# Patient Record
Sex: Female | Born: 1955 | Race: Black or African American | Hispanic: No | Marital: Married | Smoking: Never smoker
Health system: Southern US, Community
[De-identification: ages and names within clinical notes are randomized; demographics above are authoritative.]

## PROBLEM LIST (undated history)

## (undated) DIAGNOSIS — I10 Essential (primary) hypertension: Secondary | ICD-10-CM

## (undated) DIAGNOSIS — R7303 Prediabetes: Secondary | ICD-10-CM

## (undated) DIAGNOSIS — E119 Type 2 diabetes mellitus without complications: Secondary | ICD-10-CM

## (undated) DIAGNOSIS — S83249A Other tear of medial meniscus, current injury, unspecified knee, initial encounter: Secondary | ICD-10-CM

## (undated) DIAGNOSIS — K219 Gastro-esophageal reflux disease without esophagitis: Secondary | ICD-10-CM

## (undated) DIAGNOSIS — E785 Hyperlipidemia, unspecified: Secondary | ICD-10-CM

## (undated) HISTORY — PX: ANKLE SURGERY: SHX546

## (undated) HISTORY — PX: ACHILLES TENDON REPAIR: SUR1153

## (undated) HISTORY — DX: Hyperlipidemia, unspecified: E78.5

## (undated) HISTORY — PX: ABDOMINAL HYSTERECTOMY: SHX81

## (undated) HISTORY — DX: Type 2 diabetes mellitus without complications: E11.9

---

## 1997-12-22 ENCOUNTER — Emergency Department (HOSPITAL_COMMUNITY): Admission: EM | Admit: 1997-12-22 | Discharge: 1997-12-22 | Payer: Self-pay | Admitting: Emergency Medicine

## 1997-12-26 ENCOUNTER — Ambulatory Visit (HOSPITAL_BASED_OUTPATIENT_CLINIC_OR_DEPARTMENT_OTHER): Admission: RE | Admit: 1997-12-26 | Discharge: 1997-12-26 | Payer: Self-pay | Admitting: *Deleted

## 2000-02-26 ENCOUNTER — Encounter: Payer: Self-pay | Admitting: Family Medicine

## 2000-02-26 ENCOUNTER — Encounter: Admission: RE | Admit: 2000-02-26 | Discharge: 2000-02-26 | Payer: Self-pay | Admitting: Family Medicine

## 2000-03-18 ENCOUNTER — Encounter: Payer: Self-pay | Admitting: Family Medicine

## 2000-03-18 ENCOUNTER — Encounter: Admission: RE | Admit: 2000-03-18 | Discharge: 2000-03-18 | Payer: Self-pay | Admitting: Family Medicine

## 2001-06-13 ENCOUNTER — Encounter: Payer: Self-pay | Admitting: Family Medicine

## 2001-06-13 ENCOUNTER — Encounter: Admission: RE | Admit: 2001-06-13 | Discharge: 2001-06-13 | Payer: Self-pay | Admitting: Family Medicine

## 2002-01-19 ENCOUNTER — Encounter: Payer: Self-pay | Admitting: Family Medicine

## 2002-01-19 ENCOUNTER — Encounter: Admission: RE | Admit: 2002-01-19 | Discharge: 2002-01-19 | Payer: Self-pay | Admitting: Family Medicine

## 2002-03-30 ENCOUNTER — Encounter: Payer: Self-pay | Admitting: Family Medicine

## 2002-03-30 ENCOUNTER — Encounter: Admission: RE | Admit: 2002-03-30 | Discharge: 2002-03-30 | Payer: Self-pay | Admitting: Family Medicine

## 2002-07-27 ENCOUNTER — Encounter: Admission: RE | Admit: 2002-07-27 | Discharge: 2002-07-27 | Payer: Self-pay | Admitting: Family Medicine

## 2002-07-27 ENCOUNTER — Encounter: Payer: Self-pay | Admitting: Family Medicine

## 2003-01-30 ENCOUNTER — Encounter: Payer: Self-pay | Admitting: Family Medicine

## 2003-01-30 ENCOUNTER — Encounter: Admission: RE | Admit: 2003-01-30 | Discharge: 2003-01-30 | Payer: Self-pay | Admitting: Family Medicine

## 2003-01-31 ENCOUNTER — Encounter: Admission: RE | Admit: 2003-01-31 | Discharge: 2003-01-31 | Payer: Self-pay | Admitting: Family Medicine

## 2003-01-31 ENCOUNTER — Encounter: Payer: Self-pay | Admitting: Family Medicine

## 2003-02-01 ENCOUNTER — Encounter: Admission: RE | Admit: 2003-02-01 | Discharge: 2003-02-01 | Payer: Self-pay | Admitting: Family Medicine

## 2003-02-01 ENCOUNTER — Encounter: Payer: Self-pay | Admitting: Family Medicine

## 2003-08-02 ENCOUNTER — Encounter: Admission: RE | Admit: 2003-08-02 | Discharge: 2003-08-02 | Payer: Self-pay | Admitting: Family Medicine

## 2003-08-30 ENCOUNTER — Encounter: Admission: RE | Admit: 2003-08-30 | Discharge: 2003-08-30 | Payer: Self-pay | Admitting: Gastroenterology

## 2004-06-26 ENCOUNTER — Ambulatory Visit (HOSPITAL_COMMUNITY): Admission: RE | Admit: 2004-06-26 | Discharge: 2004-06-26 | Payer: Self-pay | Admitting: Gastroenterology

## 2004-08-07 ENCOUNTER — Encounter: Admission: RE | Admit: 2004-08-07 | Discharge: 2004-08-07 | Payer: Self-pay | Admitting: Family Medicine

## 2004-11-11 ENCOUNTER — Ambulatory Visit (HOSPITAL_COMMUNITY): Admission: RE | Admit: 2004-11-11 | Discharge: 2004-11-11 | Payer: Self-pay | Admitting: General Surgery

## 2004-11-11 ENCOUNTER — Encounter (INDEPENDENT_AMBULATORY_CARE_PROVIDER_SITE_OTHER): Payer: Self-pay | Admitting: *Deleted

## 2005-07-21 ENCOUNTER — Ambulatory Visit: Payer: Self-pay | Admitting: Licensed Clinical Social Worker

## 2005-08-03 ENCOUNTER — Ambulatory Visit: Payer: Self-pay | Admitting: Licensed Clinical Social Worker

## 2005-08-20 ENCOUNTER — Ambulatory Visit: Payer: Self-pay | Admitting: Licensed Clinical Social Worker

## 2005-08-27 ENCOUNTER — Encounter: Admission: RE | Admit: 2005-08-27 | Discharge: 2005-08-27 | Payer: Self-pay | Admitting: Family Medicine

## 2005-09-10 ENCOUNTER — Ambulatory Visit: Payer: Self-pay | Admitting: Licensed Clinical Social Worker

## 2006-02-02 ENCOUNTER — Emergency Department (HOSPITAL_COMMUNITY): Admission: EM | Admit: 2006-02-02 | Discharge: 2006-02-02 | Payer: Self-pay | Admitting: Emergency Medicine

## 2006-02-06 ENCOUNTER — Emergency Department (HOSPITAL_COMMUNITY): Admission: EM | Admit: 2006-02-06 | Discharge: 2006-02-06 | Payer: Self-pay | Admitting: Emergency Medicine

## 2006-02-09 ENCOUNTER — Encounter: Admission: RE | Admit: 2006-02-09 | Discharge: 2006-02-09 | Payer: Self-pay | Admitting: Family Medicine

## 2006-09-05 ENCOUNTER — Encounter: Admission: RE | Admit: 2006-09-05 | Discharge: 2006-09-05 | Payer: Self-pay | Admitting: Family Medicine

## 2006-11-18 ENCOUNTER — Encounter: Admission: RE | Admit: 2006-11-18 | Discharge: 2006-11-18 | Payer: Self-pay | Admitting: Internal Medicine

## 2007-09-15 ENCOUNTER — Encounter: Admission: RE | Admit: 2007-09-15 | Discharge: 2007-09-15 | Payer: Self-pay | Admitting: Family Medicine

## 2008-04-29 ENCOUNTER — Ambulatory Visit (HOSPITAL_COMMUNITY): Admission: RE | Admit: 2008-04-29 | Discharge: 2008-04-29 | Payer: Self-pay | Admitting: General Surgery

## 2008-04-29 ENCOUNTER — Encounter (HOSPITAL_BASED_OUTPATIENT_CLINIC_OR_DEPARTMENT_OTHER): Payer: Self-pay | Admitting: General Surgery

## 2008-10-04 ENCOUNTER — Encounter: Admission: RE | Admit: 2008-10-04 | Discharge: 2008-10-04 | Payer: Self-pay | Admitting: Family Medicine

## 2009-04-18 ENCOUNTER — Encounter: Admission: RE | Admit: 2009-04-18 | Discharge: 2009-04-18 | Payer: Self-pay | Admitting: Family Medicine

## 2009-10-10 ENCOUNTER — Encounter: Admission: RE | Admit: 2009-10-10 | Discharge: 2009-10-10 | Payer: Self-pay | Admitting: Family Medicine

## 2010-02-27 ENCOUNTER — Ambulatory Visit: Payer: Self-pay | Admitting: Licensed Clinical Social Worker

## 2010-04-17 ENCOUNTER — Other Ambulatory Visit: Admission: RE | Admit: 2010-04-17 | Discharge: 2010-04-17 | Payer: Self-pay | Admitting: Family Medicine

## 2010-09-05 ENCOUNTER — Encounter: Payer: Self-pay | Admitting: Gastroenterology

## 2010-09-28 ENCOUNTER — Other Ambulatory Visit: Payer: Self-pay | Admitting: Family Medicine

## 2010-09-28 DIAGNOSIS — Z139 Encounter for screening, unspecified: Secondary | ICD-10-CM

## 2010-10-16 ENCOUNTER — Ambulatory Visit
Admission: RE | Admit: 2010-10-16 | Discharge: 2010-10-16 | Disposition: A | Discharge status: Home / Self Care | Payer: BC Managed Care – PPO | Source: Ambulatory Visit | Attending: Family Medicine | Admitting: Family Medicine

## 2010-10-16 DIAGNOSIS — Z139 Encounter for screening, unspecified: Secondary | ICD-10-CM

## 2010-12-29 NOTE — Op Note (Signed)
NAME:  Cheryl Baker, Cheryl Baker NO.:  [id number]   MEDICAL RECORD NO.:  [id number]          PATIENT TYPE:  AMB   LOCATION:  DAY                          FACILITY:  [facility]   PHYSICIAN:  Leonie Man, M.D.   DATE OF BIRTH:  13-May-1956   DATE OF PROCEDURE:  04/29/2008  DATE OF DISCHARGE:                               OPERATIVE REPORT   PREOPERATIVE DIAGNOSIS:  Hemorrhoidal disease   POSTOPERATIVE DIAGNOSIS:  Hemorrhoidal disease.   PROCEDURE:  Hemorrhoidectomy.   SURGEON:  Leonie Man, M.D.   ASSISTANT:  OR tech   ANESTHESIA:  General.   The patient positioned prone jackknife position.   Cheryl Baker is a 52-year lady who underwent BPH, hemorrhoidectomy in the  past in 2006.  She has, over that time, maintained rather large external  hemorrhoids and some persistence of her internal hemorrhoids.  She comes  to the operating room now for a hemorrhoidectomy after risks and  potential benefits of surgery have been discussed, all questions  answered and consent obtained.   PROCEDURE:  The patient is positioned prone jackknife position following  induction of satisfactory general anesthesia.  The operation to be for  performed is hemorrhoidectomy and the patient positively identified.  All of preoperative surgical precautions were carried out.  The perianal  tissues were infiltrated with 1% lidocaine with epinephrine and the anal  verge dilated up to 3 fingerbreadths.  The operating anoscope was placed  in and the hemorrhoid is located at 4 o'clock, 8 o'clock and 6 o'clock  position.  The patient is in the prone jackknife position, identified,  each of these hemorrhoids was then infiltrated with Xeroform of 1%  lidocaine with epinephrine.  I made an elliptical incision over the  hemorrhoid, carrying it across the mucosa and mucocutaneous junction,  dissecting the hemorrhoid off of hemorrhoids off of the external  sphincter muscle and removing it in its entirety.  The  mucosa and  mucocutaneous junction are closed with a running suture of 4-0 chromic  catgut.  Similarly, a hemorrhoid at the 8 o'clock position and 6 o'clock  position were then incised with elliptical incision carried out over the  mucosa and mucocutaneous junction with passing of the hemorrhoid off of  the SI sphincter muscle and removing it in its entirety.  All incisions  were closed with running suture of 4-0 chromic catgut carried along the  mucosa and to mucocutaneous junction.  Hemorrhoids removed and forwarded  for pathologic evaluation.  All the dissection checked for hemostasis.  Hemostasis was noted to be excellent.  Sponge and instrument counts were  verified.  I then  placed Gelfoam pads over each of the incisions.  This was soaked in  lidocaine with epinephrine.  A sterile dressing was applied and the  anesthetic reversed.  The patient removed from the operating room to the  recovery room in stable condition.  She tolerated the procedure well.      Leonie Man, M.D.  Electronically Signed     PB/MEDQ  D:  04/29/2008  T:  04/30/2008  Job:  [id number]

## 2011-01-01 NOTE — Op Note (Signed)
NAME:  Cheryl Baker, Cheryl Baker NO.:  [id number]   MEDICAL RECORD NO.:  [id number]          PATIENT TYPE:  AMB   LOCATION:  ENDO                         FACILITY:  [facility]   PHYSICIAN:  Petra Kuba, M.D.    DATE OF BIRTH:  Apr 12, 1956   DATE OF PROCEDURE:  06/26/2004  DATE OF DISCHARGE:                                 OPERATIVE REPORT   PROCEDURE:  Esophagogastroduodenoscopy.   INDICATIONS:  Upper tract symptoms, some help to Protonix.  Want to re-  evaluate.  Consent was signed after risks, benefits, methods, and options  thoroughly discussed in the office.   MEDICINES USED:  None since it followed the colonoscopy.   PROCEDURE:  The video endoscope was inserted by direct vision.  The proximal  and midesophagus was normal.  In the distal esophagus was a tiny to small  hiatal hernia.  Maybe there was a slight ring just above it seen.  The scope  passed easily through this area.  However, she had some retching and  belching at this junction and a little heme was seen at the GE junction;  however, on repeat evaluation no active bleeding was seen, and the blood was  washed and suctioned.  Possibly it was due to a little bit of trauma from  either the scope or a minimal Mallory-Weiss tear on the ring from the  belching.  The scope passed through a normal antrum, normal  pylorus, into a  normal duodenal bulb.  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  We were able to advance around the C-loop to a normal second  portion of the duodenum.  The scope was withdrawn back to the bulb, and a  good look there ruled out any abnormalities there except for the  diverticulum.  The scope was withdrawn back to the stomach and retroflexed.  High in the cardia the tiny to small hiatal hernia was confirmed.  The  fundus, angularis, lesser and greater curve were normal on retroflex  visualization.  Straight visualization in the stomach did not reveal any  additional  findings.  Air was suctioned and the scope was slowly withdrawn.  Again a good look at the distal esophagus did not reveal any active bleeding  and confirmed the above findings.  The scope was removed.  The patient  tolerated the procedure well.  No other esophageal abnormality seen.  There  was no obvious immediate significant complication.   ENDOSCOPIC DIAGNOSES:  1.  Tiny to small hiatal hernia with minimal ring, status post minimal      trauma or Mallory-Weiss tear with belching versus scope trauma.  2.  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  3.  Otherwise normal esophagogastroduodenoscopy.   PLAN:  1.  Continue pump inhibitors.  2.  Very careful with aspirin and nonsteroidals.  3.  Follow up with me p.r.n., otherwise return care to Dr. Arvilla Market as      mentioned in colonoscopy report.       MEM/MEDQ  D:  06/26/2004  T:  06/27/2004  Job:  [id number]   cc:   Izora Ribas  Arvilla Market, M.D.  [address] [place]  [place] [address]  Fax: [phone]

## 2011-01-01 NOTE — Op Note (Signed)
NAME:  [name], [name] NO.:  [id number]   MEDICAL RECORD NO.:  [id number]          PATIENT TYPE:  AMB   LOCATION:  DAY                          FACILITY:  [facility]   PHYSICIAN:  Leonie Man, M.D.   DATE OF BIRTH:  11/09/1955   DATE OF PROCEDURE:  11/11/2004  DATE OF DISCHARGE:                                 OPERATIVE REPORT   PREOPERATIVE DIAGNOSIS:  Grade 4 prolapsing hemorrhoidal disease.   POSTOPERATIVE DIAGNOSIS:  Grade 4 prolapsing hemorrhoidal disease.   PROCEDURE:  PPH (procedure for prolapsing hemorrhoid).   SURGEON:  Leonie Man, M.D.   ASSISTANT:  OR tech.   ANESTHESIA:  General. The patient is in the prone jackknife position.   DESCRIPTION OF PROCEDURE:  The patient is a 55 year old female with  permanently prolapsing internal hemorrhoidal disease who comes to the  operating room now for a procedure for hemorrhoidal prolapse. The risks and  potential benefits of surgery have been fully discussed with her, all  questions answered, and consent obtained.   The patient was brought to the operating room and positively identified to  be Cheryl Baker. Following induction of satisfactory general anesthesia,  she was positioned in the prone jackknife position and the buttock cheeks  were spread apart and held with tapes. The perianal tissues as well as the  vaginal vault were prepped with Betadine. Sterile drapes were applied. The  perianal tissues were infiltrated with 0.25% Marcaine with epinephrine in a  9:1 solution with Wydase. Dilatation of the anal verge up to three  fingerbreadths was then carried out. The operating anoscope was placed into  the anus and a pursestring suture was placed approximately 4-4-1/2 cm above  the dentate line and carried around as a pursestring suture. The vagina was  checked to be sure that there were no sutures holding the vaginal septum.  The PTH stepping device was then placed with the anvil above the staple  line  and this was drawn taut and tied. The stapling device was then screwed down  into the anal vault down to the 4 cm mark and held in place for 1 minute to  allow the edema to be removed from the tissues.  The instrument was then  fired and held for an additional 1 minute and then removed. A complete  circle of mucosa was removed. Inspection along the suture line showed very  few areas of bleeding which were easily coagulated with electrocautery. All  sponge, instrument and sharp counts were then verified. A Gelfoam plug was  placed into the anus for additional hemostasis. The sterile dressings were  then placed on the wound, the anesthetic reversed. The patient was moved  from the operating room to the recovery room in stable condition. She  tolerated the procedure well.     PB/MEDQ  D:  11/11/2004  T:  11/11/2004  Job:  [id number]

## 2011-01-01 NOTE — Op Note (Signed)
NAME:  CARLOS, QUACKENBUSH NO.:  [id number]   MEDICAL RECORD NO.:  [id number]          PATIENT TYPE:  AMB   LOCATION:  ENDO                         FACILITY:  [facility]   PHYSICIAN:  Petra Kuba, M.D.    DATE OF BIRTH:  Feb 10, 1956   DATE OF PROCEDURE:  06/26/2004  DATE OF DISCHARGE:                                 OPERATIVE REPORT   PROCEDURE:  Colonoscopy.   INDICATIONS:  Screening.  Consent was signed after risks, benefits, methods,  and options thoroughly discussed in the office.   MEDICINES USED:  Demerol 75 mg, Versed 7 mg.   PROCEDURE:  Rectal inspection was pertinent for external hemorrhoids, small.  Digital exam was negative.  The video pediatric adjustable colonoscope was  inserted, easily advanced around the colon to the cecum.  This did not  require any abdominal pressure or any position changes.  No abnormalities  were seen on insertion.  The scope was inserted a short way into the  terminal ileum, which was normal.  Photo documentation was obtained.  The  cecum and the IC valve were normal.  The scope was slowly withdrawn.  The  prep was adequate.  There was some liquid stool that required washing and  suctioning.  On slow withdrawal through the colon, no abnormalities were  seen.  Once back in the rectum anorectal pull-through and retroflexion  confirmed some small hemorrhoids.  The scope was straightened and readvanced  a short way up the left side of the colon, air was suctioned, and the scope  removed.  The patient tolerated the procedure well.  There was no obvious  immediate complication.   ENDOSCOPIC DIAGNOSES:  1.  Internal-external small hemorrhoids with some small skin tags.  2.  Otherwise within normal limits to the terminal ileum.   PLAN:  1.  Return to Dr. Lurene Shadow p.r.n. for hemorrhoids.  2.  Continue workup with an EGD.  3.  Repeat screening in five to 10 years.  4.  Yearly rectals and guaiacs per Dr. Arvilla Market.       MEM/MEDQ  D:   06/26/2004  T:  06/27/2004  Job:  [id number]   cc:   Donia Guiles, M.D.  [address] [place]  [place] [address]  Fax: [phone]   Leonie Man, M.D.  [address]  [address]  [place]  [place] [address]  Fax: [phone]

## 2011-05-19 LAB — COMPREHENSIVE METABOLIC PANEL
Albumin: 4
CO2: 29
Calcium: 9.2
Chloride: 102
Creatinine, Ser: 0.83
GFR calc Af Amer: >60
GFR calc non Af Amer: >60
Potassium: 3.2 — ABNORMAL LOW
Total Protein: 7.9

## 2011-05-19 LAB — CBC
Platelets: 220
RBC: 5.1
WBC: 5.4

## 2011-05-19 LAB — DIFFERENTIAL
Basophils Absolute: 0
Basophils Relative: 1
Eosinophils Relative: 2
Lymphs Abs: 1.9
Monocytes Absolute: 0.5
Neutrophils Relative %: 52

## 2011-09-14 ENCOUNTER — Other Ambulatory Visit: Payer: Self-pay | Admitting: Family Medicine

## 2011-09-14 DIAGNOSIS — Z1231 Encounter for screening mammogram for malignant neoplasm of breast: Secondary | ICD-10-CM

## 2011-10-22 ENCOUNTER — Ambulatory Visit: Payer: BC Managed Care – PPO

## 2011-11-04 ENCOUNTER — Ambulatory Visit
Admission: RE | Admit: 2011-11-04 | Discharge: 2011-11-04 | Disposition: A | Discharge status: Home / Self Care | Payer: BC Managed Care – PPO | Source: Ambulatory Visit | Attending: Family Medicine | Admitting: Family Medicine

## 2011-11-04 DIAGNOSIS — Z1231 Encounter for screening mammogram for malignant neoplasm of breast: Secondary | ICD-10-CM

## 2011-11-05 ENCOUNTER — Ambulatory Visit: Payer: BC Managed Care – PPO

## 2011-11-21 ENCOUNTER — Other Ambulatory Visit: Payer: Self-pay

## 2011-11-21 ENCOUNTER — Encounter (HOSPITAL_COMMUNITY): Payer: Self-pay | Admitting: *Deleted

## 2011-11-21 ENCOUNTER — Emergency Department (HOSPITAL_COMMUNITY)
Admission: EM | Admit: 2011-11-21 | Discharge: 2011-11-21 | Disposition: A | Discharge status: Home / Self Care | Payer: BC Managed Care – PPO | Attending: Emergency Medicine | Admitting: Emergency Medicine

## 2011-11-21 ENCOUNTER — Emergency Department (HOSPITAL_COMMUNITY): Payer: BC Managed Care – PPO

## 2011-11-21 DIAGNOSIS — R05 Cough: Secondary | ICD-10-CM | POA: Insufficient documentation

## 2011-11-21 DIAGNOSIS — R059 Cough, unspecified: Secondary | ICD-10-CM | POA: Insufficient documentation

## 2011-11-21 DIAGNOSIS — R079 Chest pain, unspecified: Secondary | ICD-10-CM | POA: Insufficient documentation

## 2011-11-21 DIAGNOSIS — I10 Essential (primary) hypertension: Secondary | ICD-10-CM | POA: Insufficient documentation

## 2011-11-21 HISTORY — DX: Essential (primary) hypertension: I10

## 2011-11-21 MED ORDER — HYDROCODONE-ACETAMINOPHEN 5-325 MG PO TABS: 1.0000 | ORAL_TABLET | Freq: Four times a day (QID) | ORAL | Status: AC | PRN

## 2011-11-21 MED ORDER — BENZONATATE 100 MG PO CAPS: 100.0000 mg | ORAL_CAPSULE | Freq: Three times a day (TID) | ORAL | Status: AC

## 2011-11-21 NOTE — Discharge Instructions (Signed)
Cough, Adult  A cough is a reflex. It helps you clear your throat and airways. A cough can help heal your body. A cough can last 2 or 3 weeks (acute) or may last more than 8 weeks (chronic). Some common causes of a cough can include an infection, allergy, or a cold. HOME CARE  Only take medicine as told by your doctor.   If given, take your medicines (antibiotics) as told. Finish them even if you start to feel better.   Use a cold steam vaporizer or humidier in your home. This can help loosen thick spit (secretions).   Sleep so you are almost sitting up (semi-upright). Use pillows to do this. This helps reduce coughing.   Rest as needed.   Stop smoking if you smoke.  GET HELP RIGHT AWAY IF:  You have yellowish-white fluid (pus) in your thick spit.   Your cough gets worse.   Your medicine does not reduce coughing, and you are losing sleep.   You cough up blood.   You have trouble breathing.   Your pain gets worse and medicine does not help.   You have a fever.  MAKE SURE YOU:   Understand these instructions.   Will watch your condition.   Will get help right away if you are not doing well or get worse.  Document Released: 04/15/2011 Document Revised: 07/22/2011 Document Reviewed: 04/15/2011 Exit[employer] Patient Information 2012 [employer]. 

## 2011-11-21 NOTE — ED Provider Notes (Signed)
History     CSN: [id number]  Arrival date & time 11/21/11  3086   First MD Initiated Contact with Patient 11/21/11 [id number]      Chief Complaint  Patient presents with  . Cough  . Chest Pain  . Nausea    HPI Pt was diagnosed with bronchitis and PNA via x-ray 1 week ago. Pt reports symptoms worse on Thursday. Pt was not given any medication for PNA/Bronchitis. Pt reports productive cough and nasal drainage.  Pt states she was seen by [facility] in [facility].  Pt did see her primary doctor as well in the last week and was told that she was on for the abscess, sulfa, would be fine.  Pt is still coughing.  She is still on an inhaler and a cough medication.  Pt has been having nasal discharge.  She has noticed some blood in her sputum.  She came to the ED because the cough is still severe.  When she coughs she will have urinary incontinence.  Past Medical History  Diagnosis Date  . Hypertension     Past Surgical History  Procedure Date  . Abdominal hysterectomy   . Ankle surgery     No family history on file.  History  Substance Use Topics  . Smoking status: Never Smoker   . Smokeless tobacco: Not on file  . Alcohol Use: No    OB History    Grav Para Term Preterm Abortions TAB SAB Ect Mult Living                  Review of Systems  Neurological: Negative for tremors.  All other systems reviewed and are negative.    Allergies  Penicillins  Home Medications   Current Outpatient Rx  Name Route Sig Dispense Refill  . ATORVASTATIN CALCIUM 20 MG PO TABS Oral Take 20 mg by mouth daily.    . OMEGA-3 FATTY ACIDS 1000 MG PO CAPS Oral Take 2 g by mouth daily.    Marland Kitchen HYDROCHLOROTHIAZIDE 25 MG PO TABS Oral Take 25 mg by mouth daily.      BP 106/87  Pulse 80  Temp(Src) 98.8 F (37.1 C) (Oral)  Resp 18  SpO2 98%  Physical Exam  Nursing note and vitals reviewed. Constitutional: She appears well-developed and well-nourished. No distress.  HENT:  Head: Normocephalic and  atraumatic.  Right Ear: External ear normal.  Left Ear: External ear normal.  Eyes: Conjunctivae are normal. Right eye exhibits no discharge. Left eye exhibits no discharge. No scleral icterus.  Neck: Neck supple. No tracheal deviation present.  Cardiovascular: Normal rate, regular rhythm and intact distal pulses.   Pulmonary/Chest: Effort normal and breath sounds normal. No stridor. No respiratory distress. She has no wheezes. She has no rales.  Abdominal: Soft. Bowel sounds are normal. She exhibits no distension. There is no tenderness. There is no rebound and no guarding.  Musculoskeletal: She exhibits no edema and no tenderness.  Neurological: She is alert. She has normal strength. No sensory deficit. Cranial nerve deficit:  no gross defecits noted. She exhibits normal muscle tone. She displays no seizure activity. Coordination normal.  Skin: Skin is warm and dry. No rash noted.  Psychiatric: She has a normal mood and affect.    ED Course  Procedures (including critical care time)  Rate: 69  Rhythm: normal sinus rhythm  QRS Axis: normal  Intervals: normal except first degree AV block  ST/T Wave abnormalities: normal  Conduction Disutrbances:none  Narrative Interpretation:  Old EKG Reviewed: First degree AV block is new  Labs Reviewed - No data to display Dg Chest 2 View  11/21/2011  *RADIOLOGY REPORT*  Clinical Data: Cough and chest pain  CHEST - 2 VIEW  Comparison: 11/13/2011  Findings: Heart size is normal.  No pleural effusion or pulmonary edema.  No airspace consolidation identified.  Scar versus plate-like atelectasis noted within the left upper lobe.  IMPRESSION:  1.  Left upper lobe scar versus plate-like atelectasis.  Original Report Authenticated By: Rosealee Albee, M.D.       MDM  The patient has been seen by her primary doctor this week. She was told she may have had pneumonia. Chest x-ray today however does not suggest pneumonia and shows a small area of  atelectasis. I do not think the patient needs to be another course of antibiotics. I suspect her symptoms are more likely related to a viral type illness. I will discharge her home on a medication for cough to help her symptoms.        Celene Kras, MD 11/21/11 [phone]

## 2011-11-21 NOTE — ED Notes (Signed)
Per pt - reports chest tightness, coughing and chills starting Wednesday and was seen at a walk-in clinic on Saturday. Pt was diagnosed with bronchitis and PNA via x-ray.  Pt reports symptoms worse on Thursday.  Pt was not given any medication for PNA/Bronchitis.  Pt has just finished Sulfamethoxazole-TMP for an abscess. Pt reports productive cough and nasal drainage.

## 2011-12-16 ENCOUNTER — Other Ambulatory Visit: Payer: Self-pay | Admitting: Family Medicine

## 2011-12-16 ENCOUNTER — Ambulatory Visit
Admission: RE | Admit: 2011-12-16 | Discharge: 2011-12-16 | Disposition: A | Discharge status: Home / Self Care | Payer: BC Managed Care – PPO | Source: Ambulatory Visit | Attending: Family Medicine | Admitting: Family Medicine

## 2011-12-16 DIAGNOSIS — J189 Pneumonia, unspecified organism: Secondary | ICD-10-CM

## 2012-10-11 ENCOUNTER — Other Ambulatory Visit: Payer: Self-pay

## 2012-10-11 ENCOUNTER — Other Ambulatory Visit: Payer: Self-pay | Admitting: Family Medicine

## 2012-10-11 DIAGNOSIS — Z1231 Encounter for screening mammogram for malignant neoplasm of breast: Secondary | ICD-10-CM

## 2012-11-07 ENCOUNTER — Ambulatory Visit
Admission: RE | Admit: 2012-11-07 | Discharge: 2012-11-07 | Disposition: A | Discharge status: Home / Self Care | Payer: BC Managed Care – PPO | Source: Ambulatory Visit

## 2012-11-07 DIAGNOSIS — Z1231 Encounter for screening mammogram for malignant neoplasm of breast: Secondary | ICD-10-CM

## 2013-09-26 ENCOUNTER — Other Ambulatory Visit: Payer: Self-pay

## 2013-09-26 DIAGNOSIS — Z1231 Encounter for screening mammogram for malignant neoplasm of breast: Secondary | ICD-10-CM

## 2013-11-09 ENCOUNTER — Ambulatory Visit: Payer: BC Managed Care – PPO

## 2013-11-09 ENCOUNTER — Ambulatory Visit
Admission: RE | Admit: 2013-11-09 | Discharge: 2013-11-09 | Disposition: A | Discharge status: Home / Self Care | Payer: Self-pay | Source: Ambulatory Visit

## 2013-11-09 DIAGNOSIS — Z1231 Encounter for screening mammogram for malignant neoplasm of breast: Secondary | ICD-10-CM

## 2014-06-26 ENCOUNTER — Ambulatory Visit
Admission: RE | Admit: 2014-06-26 | Discharge: 2014-06-26 | Disposition: A | Discharge status: Home / Self Care | Payer: BC Managed Care – PPO | Source: Ambulatory Visit | Attending: Family Medicine | Admitting: Family Medicine

## 2014-06-26 ENCOUNTER — Other Ambulatory Visit: Payer: Self-pay | Admitting: Family Medicine

## 2014-06-26 DIAGNOSIS — R52 Pain, unspecified: Secondary | ICD-10-CM

## 2014-09-20 ENCOUNTER — Ambulatory Visit: Payer: Self-pay | Admitting: Licensed Clinical Social Worker

## 2014-12-06 ENCOUNTER — Other Ambulatory Visit: Payer: Self-pay

## 2014-12-06 DIAGNOSIS — Z1231 Encounter for screening mammogram for malignant neoplasm of breast: Secondary | ICD-10-CM

## 2014-12-11 ENCOUNTER — Ambulatory Visit
Admission: RE | Admit: 2014-12-11 | Discharge: 2014-12-11 | Disposition: A | Discharge status: Home / Self Care | Payer: BLUE CROSS/BLUE SHIELD | Source: Ambulatory Visit

## 2014-12-11 DIAGNOSIS — Z1231 Encounter for screening mammogram for malignant neoplasm of breast: Secondary | ICD-10-CM

## 2015-11-14 ENCOUNTER — Other Ambulatory Visit: Payer: Self-pay

## 2015-11-14 DIAGNOSIS — Z1231 Encounter for screening mammogram for malignant neoplasm of breast: Secondary | ICD-10-CM

## 2015-12-03 DIAGNOSIS — Z83511 Family history of glaucoma: Secondary | ICD-10-CM | POA: Diagnosis not present

## 2015-12-03 DIAGNOSIS — H40053 Ocular hypertension, bilateral: Secondary | ICD-10-CM | POA: Diagnosis not present

## 2015-12-03 DIAGNOSIS — H40013 Open angle with borderline findings, low risk, bilateral: Secondary | ICD-10-CM | POA: Diagnosis not present

## 2015-12-04 DIAGNOSIS — L658 Other specified nonscarring hair loss: Secondary | ICD-10-CM | POA: Diagnosis not present

## 2015-12-04 DIAGNOSIS — L249 Irritant contact dermatitis, unspecified cause: Secondary | ICD-10-CM | POA: Diagnosis not present

## 2015-12-04 DIAGNOSIS — L219 Seborrheic dermatitis, unspecified: Secondary | ICD-10-CM | POA: Diagnosis not present

## 2015-12-12 ENCOUNTER — Ambulatory Visit
Admission: RE | Admit: 2015-12-12 | Discharge: 2015-12-12 | Disposition: A | Discharge status: Home / Self Care | Payer: BLUE CROSS/BLUE SHIELD | Source: Ambulatory Visit

## 2015-12-12 DIAGNOSIS — Z1231 Encounter for screening mammogram for malignant neoplasm of breast: Secondary | ICD-10-CM | POA: Diagnosis not present

## 2015-12-29 ENCOUNTER — Other Ambulatory Visit: Payer: Self-pay | Admitting: Family Medicine

## 2015-12-29 DIAGNOSIS — E782 Mixed hyperlipidemia: Secondary | ICD-10-CM | POA: Diagnosis not present

## 2015-12-29 DIAGNOSIS — E669 Obesity, unspecified: Secondary | ICD-10-CM | POA: Diagnosis not present

## 2015-12-29 DIAGNOSIS — R1310 Dysphagia, unspecified: Secondary | ICD-10-CM

## 2015-12-29 DIAGNOSIS — I10 Essential (primary) hypertension: Secondary | ICD-10-CM | POA: Diagnosis not present

## 2015-12-29 DIAGNOSIS — R7303 Prediabetes: Secondary | ICD-10-CM | POA: Diagnosis not present

## 2016-01-01 ENCOUNTER — Ambulatory Visit
Admission: RE | Admit: 2016-01-01 | Discharge: 2016-01-01 | Disposition: A | Discharge status: Home / Self Care | Payer: BLUE CROSS/BLUE SHIELD | Source: Ambulatory Visit | Attending: Family Medicine | Admitting: Family Medicine

## 2016-01-01 DIAGNOSIS — R1312 Dysphagia, oropharyngeal phase: Secondary | ICD-10-CM | POA: Diagnosis not present

## 2016-01-01 DIAGNOSIS — R1310 Dysphagia, unspecified: Secondary | ICD-10-CM

## 2016-01-01 DIAGNOSIS — K219 Gastro-esophageal reflux disease without esophagitis: Secondary | ICD-10-CM | POA: Diagnosis not present

## 2016-01-23 DIAGNOSIS — M7672 Peroneal tendinitis, left leg: Secondary | ICD-10-CM | POA: Diagnosis not present

## 2016-02-06 DIAGNOSIS — M7672 Peroneal tendinitis, left leg: Secondary | ICD-10-CM | POA: Diagnosis not present

## 2016-02-06 DIAGNOSIS — R262 Difficulty in walking, not elsewhere classified: Secondary | ICD-10-CM | POA: Diagnosis not present

## 2016-06-03 DIAGNOSIS — Z83511 Family history of glaucoma: Secondary | ICD-10-CM | POA: Diagnosis not present

## 2016-06-03 DIAGNOSIS — H40013 Open angle with borderline findings, low risk, bilateral: Secondary | ICD-10-CM | POA: Diagnosis not present

## 2016-06-15 DIAGNOSIS — L669 Cicatricial alopecia, unspecified: Secondary | ICD-10-CM | POA: Diagnosis not present

## 2016-06-15 DIAGNOSIS — L219 Seborrheic dermatitis, unspecified: Secondary | ICD-10-CM | POA: Diagnosis not present

## 2016-07-13 DIAGNOSIS — H40013 Open angle with borderline findings, low risk, bilateral: Secondary | ICD-10-CM | POA: Diagnosis not present

## 2016-07-22 DIAGNOSIS — Z23 Encounter for immunization: Secondary | ICD-10-CM | POA: Diagnosis not present

## 2016-07-22 DIAGNOSIS — F325 Major depressive disorder, single episode, in full remission: Secondary | ICD-10-CM | POA: Diagnosis not present

## 2016-07-22 DIAGNOSIS — I10 Essential (primary) hypertension: Secondary | ICD-10-CM | POA: Diagnosis not present

## 2016-07-22 DIAGNOSIS — Z0000 Encounter for general adult medical examination without abnormal findings: Secondary | ICD-10-CM | POA: Diagnosis not present

## 2016-07-22 DIAGNOSIS — R7303 Prediabetes: Secondary | ICD-10-CM | POA: Diagnosis not present

## 2016-07-22 DIAGNOSIS — E782 Mixed hyperlipidemia: Secondary | ICD-10-CM | POA: Diagnosis not present

## 2016-08-11 DIAGNOSIS — R0981 Nasal congestion: Secondary | ICD-10-CM | POA: Diagnosis not present

## 2016-08-11 DIAGNOSIS — J069 Acute upper respiratory infection, unspecified: Secondary | ICD-10-CM | POA: Diagnosis not present

## 2016-08-25 DIAGNOSIS — R062 Wheezing: Secondary | ICD-10-CM | POA: Diagnosis not present

## 2016-08-25 DIAGNOSIS — J329 Chronic sinusitis, unspecified: Secondary | ICD-10-CM | POA: Diagnosis not present

## 2016-08-31 DIAGNOSIS — J329 Chronic sinusitis, unspecified: Secondary | ICD-10-CM | POA: Diagnosis not present

## 2016-10-25 DIAGNOSIS — R7303 Prediabetes: Secondary | ICD-10-CM | POA: Diagnosis not present

## 2016-10-25 DIAGNOSIS — E782 Mixed hyperlipidemia: Secondary | ICD-10-CM | POA: Diagnosis not present

## 2016-11-05 DIAGNOSIS — Z23 Encounter for immunization: Secondary | ICD-10-CM | POA: Diagnosis not present

## 2016-11-05 DIAGNOSIS — E782 Mixed hyperlipidemia: Secondary | ICD-10-CM | POA: Diagnosis not present

## 2016-11-05 DIAGNOSIS — I10 Essential (primary) hypertension: Secondary | ICD-10-CM | POA: Diagnosis not present

## 2016-11-05 DIAGNOSIS — E119 Type 2 diabetes mellitus without complications: Secondary | ICD-10-CM | POA: Diagnosis not present

## 2016-11-23 ENCOUNTER — Other Ambulatory Visit: Payer: Self-pay | Admitting: Family Medicine

## 2016-11-23 DIAGNOSIS — Z1231 Encounter for screening mammogram for malignant neoplasm of breast: Secondary | ICD-10-CM

## 2016-12-07 DIAGNOSIS — L669 Cicatricial alopecia, unspecified: Secondary | ICD-10-CM | POA: Diagnosis not present

## 2016-12-07 DIAGNOSIS — L658 Other specified nonscarring hair loss: Secondary | ICD-10-CM | POA: Diagnosis not present

## 2016-12-07 DIAGNOSIS — L219 Seborrheic dermatitis, unspecified: Secondary | ICD-10-CM | POA: Diagnosis not present

## 2016-12-09 ENCOUNTER — Encounter: Payer: BLUE CROSS/BLUE SHIELD | Attending: Family Medicine | Admitting: Dietician

## 2016-12-09 ENCOUNTER — Encounter: Payer: Self-pay | Admitting: Dietician

## 2016-12-09 DIAGNOSIS — E119 Type 2 diabetes mellitus without complications: Secondary | ICD-10-CM | POA: Diagnosis not present

## 2016-12-09 DIAGNOSIS — Z713 Dietary counseling and surveillance: Secondary | ICD-10-CM | POA: Insufficient documentation

## 2016-12-09 NOTE — Progress Notes (Signed)
Patient was seen on 12/09/16 for the first of a series of three diabetes self-management courses at the [facility].  Patient Education Plan per assessed needs and concerns is to attend four course education program for Diabetes Self Management Education.  The following learning objectives were met by the patient during this class:  Describe diabetes  State some common risk factors for diabetes  Defines the role of glucose and insulin  Identifies type of diabetes and pathophysiology  Describe the relationship between diabetes and cardiovascular risk  State the members of the Healthcare Team  States the rationale for glucose monitoring  State when to test glucose  State their individual Target Range  State the importance of logging glucose readings  Describe how to interpret glucose readings  Identifies A1C target  Explain the correlation between A1c and eAG values  State symptoms and treatment of high blood glucose  State symptoms and treatment of low blood glucose  Explain proper technique for glucose testing  Identifies proper sharps disposal  Handouts given during class include:  Living Well with Diabetes book  Carb Counting and Meal Planning book  Meal Plan Card  Carbohydrate guide  Meal planning worksheet  Low Sodium Flavoring Tips  The diabetes portion plate  P5Q to eAG Conversion Chart  Diabetes Medications  Diabetes Recommended Care Schedule  Support Group  Diabetes Success Plan  Core Class Satisfaction Survey  Follow-Up Plan:  Attend core 2

## 2016-12-15 ENCOUNTER — Ambulatory Visit
Admission: RE | Admit: 2016-12-15 | Discharge: 2016-12-15 | Disposition: A | Discharge status: Home / Self Care | Payer: BLUE CROSS/BLUE SHIELD | Source: Ambulatory Visit | Attending: Family Medicine | Admitting: Family Medicine

## 2016-12-15 DIAGNOSIS — Z1231 Encounter for screening mammogram for malignant neoplasm of breast: Secondary | ICD-10-CM | POA: Diagnosis not present

## 2016-12-16 ENCOUNTER — Encounter: Payer: BLUE CROSS/BLUE SHIELD | Attending: Family Medicine | Admitting: Dietician

## 2016-12-16 DIAGNOSIS — Z713 Dietary counseling and surveillance: Secondary | ICD-10-CM | POA: Insufficient documentation

## 2016-12-16 DIAGNOSIS — E119 Type 2 diabetes mellitus without complications: Secondary | ICD-10-CM | POA: Insufficient documentation

## 2016-12-16 NOTE — Progress Notes (Signed)

## 2016-12-23 ENCOUNTER — Encounter: Payer: BLUE CROSS/BLUE SHIELD | Admitting: Dietician

## 2016-12-23 DIAGNOSIS — Z713 Dietary counseling and surveillance: Secondary | ICD-10-CM | POA: Diagnosis not present

## 2016-12-23 DIAGNOSIS — E119 Type 2 diabetes mellitus without complications: Secondary | ICD-10-CM

## 2016-12-23 NOTE — Progress Notes (Signed)
Patient was seen on 12/23/16 for the third of a series of three diabetes self-management courses at the [facility].   Cheryl Baker. State the amount of activity recommended for healthy living . Describe activities suitable for individual needs . Identify ways to regularly incorporate activity into daily life . Identify barriers to activity and ways to over come these barriers  Identify diabetes medications being personally used and their primary action for lowering glucose and possible side effects . Describe role of stress on blood glucose and develop strategies to address psychosocial issues . Identify diabetes complications and ways to prevent them  Explain how to manage diabetes during illness . Evaluate success in meeting personal goal . Establish 2-3 goals that they will plan to diligently work on until they return for the  2637-month follow-up visit  Goals:   I will count my carb choices at most meals and snacks  I will eat out less for lunch  I will be active 30 minutes or more 3 times a week  I will look at patterns in my record book at least 5 days a month  To help manage stress I will  exercise at least 3 times a week  Your patient has identified these potential barriers to change:  Motivation Finances Stress  Your patient has identified their diabetes self-care support plan as  Magazine Subscriptions Plan:  Attend Support Group as desired

## 2016-12-27 DIAGNOSIS — H40053 Ocular hypertension, bilateral: Secondary | ICD-10-CM | POA: Diagnosis not present

## 2016-12-27 DIAGNOSIS — H40013 Open angle with borderline findings, low risk, bilateral: Secondary | ICD-10-CM | POA: Diagnosis not present

## 2017-02-01 ENCOUNTER — Ambulatory Visit: Payer: BLUE CROSS/BLUE SHIELD | Admitting: Psychiatry

## 2017-03-21 DIAGNOSIS — Z0279 Encounter for issue of other medical certificate: Secondary | ICD-10-CM

## 2017-07-27 DIAGNOSIS — K5730 Diverticulosis of large intestine without perforation or abscess without bleeding: Secondary | ICD-10-CM | POA: Diagnosis not present

## 2017-07-27 DIAGNOSIS — Z86010 Personal history of colonic polyps: Secondary | ICD-10-CM | POA: Diagnosis not present

## 2017-08-04 DIAGNOSIS — E119 Type 2 diabetes mellitus without complications: Secondary | ICD-10-CM | POA: Diagnosis not present

## 2017-08-04 DIAGNOSIS — Z0000 Encounter for general adult medical examination without abnormal findings: Secondary | ICD-10-CM | POA: Diagnosis not present

## 2017-08-12 DIAGNOSIS — H40013 Open angle with borderline findings, low risk, bilateral: Secondary | ICD-10-CM | POA: Diagnosis not present

## 2017-08-12 DIAGNOSIS — Z83511 Family history of glaucoma: Secondary | ICD-10-CM | POA: Diagnosis not present

## 2017-08-12 DIAGNOSIS — H40053 Ocular hypertension, bilateral: Secondary | ICD-10-CM | POA: Diagnosis not present

## 2017-10-31 ENCOUNTER — Telehealth: Payer: Self-pay

## 2017-10-31 NOTE — Telephone Encounter (Signed)
LVM for patient to call back. I received FMLA forms and I have a few questions to ask before Dr. Okey Duprerawford can fill them out.  What dates does patient need to be out for?  When does Patient plan to go back to work?  When does patient plan on starting FMLA or if already out of  work what date was that?

## 2017-11-01 NOTE — Telephone Encounter (Signed)
This documentation was in error.

## 2017-11-08 DIAGNOSIS — Z0279 Encounter for issue of other medical certificate: Secondary | ICD-10-CM

## 2017-12-14 ENCOUNTER — Other Ambulatory Visit: Payer: Self-pay | Admitting: Family Medicine

## 2017-12-14 DIAGNOSIS — Z1231 Encounter for screening mammogram for malignant neoplasm of breast: Secondary | ICD-10-CM

## 2017-12-23 DIAGNOSIS — M25569 Pain in unspecified knee: Secondary | ICD-10-CM | POA: Diagnosis not present

## 2018-01-04 ENCOUNTER — Ambulatory Visit: Payer: Self-pay

## 2018-02-03 DIAGNOSIS — E782 Mixed hyperlipidemia: Secondary | ICD-10-CM | POA: Diagnosis not present

## 2018-02-03 DIAGNOSIS — I10 Essential (primary) hypertension: Secondary | ICD-10-CM | POA: Diagnosis not present

## 2018-02-03 DIAGNOSIS — E669 Obesity, unspecified: Secondary | ICD-10-CM | POA: Diagnosis not present

## 2018-02-03 DIAGNOSIS — E1169 Type 2 diabetes mellitus with other specified complication: Secondary | ICD-10-CM | POA: Diagnosis not present

## 2018-04-12 ENCOUNTER — Ambulatory Visit
Admission: RE | Admit: 2018-04-12 | Discharge: 2018-04-12 | Disposition: A | Discharge status: Home / Self Care | Payer: BLUE CROSS/BLUE SHIELD | Source: Ambulatory Visit | Attending: Family Medicine | Admitting: Family Medicine

## 2018-04-12 DIAGNOSIS — Z1231 Encounter for screening mammogram for malignant neoplasm of breast: Secondary | ICD-10-CM | POA: Diagnosis not present

## 2018-04-13 DIAGNOSIS — H40013 Open angle with borderline findings, low risk, bilateral: Secondary | ICD-10-CM | POA: Diagnosis not present

## 2018-06-02 DIAGNOSIS — Z23 Encounter for immunization: Secondary | ICD-10-CM | POA: Diagnosis not present

## 2018-07-07 DIAGNOSIS — H6980 Other specified disorders of Eustachian tube, unspecified ear: Secondary | ICD-10-CM | POA: Diagnosis not present

## 2018-07-07 DIAGNOSIS — J069 Acute upper respiratory infection, unspecified: Secondary | ICD-10-CM | POA: Diagnosis not present

## 2018-08-08 DIAGNOSIS — H40013 Open angle with borderline findings, low risk, bilateral: Secondary | ICD-10-CM | POA: Diagnosis not present

## 2018-08-11 DIAGNOSIS — E782 Mixed hyperlipidemia: Secondary | ICD-10-CM | POA: Diagnosis not present

## 2018-08-11 DIAGNOSIS — Z0000 Encounter for general adult medical examination without abnormal findings: Secondary | ICD-10-CM | POA: Diagnosis not present

## 2018-08-11 DIAGNOSIS — E1169 Type 2 diabetes mellitus with other specified complication: Secondary | ICD-10-CM | POA: Diagnosis not present

## 2018-08-11 DIAGNOSIS — F325 Major depressive disorder, single episode, in full remission: Secondary | ICD-10-CM | POA: Diagnosis not present

## 2018-08-11 DIAGNOSIS — I10 Essential (primary) hypertension: Secondary | ICD-10-CM | POA: Diagnosis not present

## 2018-08-25 ENCOUNTER — Ambulatory Visit (INDEPENDENT_AMBULATORY_CARE_PROVIDER_SITE_OTHER): Payer: Self-pay

## 2018-08-25 ENCOUNTER — Encounter (INDEPENDENT_AMBULATORY_CARE_PROVIDER_SITE_OTHER): Payer: Self-pay | Admitting: Family Medicine

## 2018-08-25 ENCOUNTER — Ambulatory Visit (INDEPENDENT_AMBULATORY_CARE_PROVIDER_SITE_OTHER): Payer: BLUE CROSS/BLUE SHIELD | Admitting: Family Medicine

## 2018-08-25 DIAGNOSIS — M25572 Pain in left ankle and joints of left foot: Secondary | ICD-10-CM | POA: Diagnosis not present

## 2018-08-25 MED ORDER — COLCHICINE 0.6 MG PO TABS: 0.6000 mg | ORAL_TABLET | Freq: Two times a day (BID) | ORAL | 3 refills | Status: DC | PRN

## 2018-08-25 MED ORDER — PREDNISONE 10 MG PO TABS: ORAL_TABLET | ORAL | 0 refills | Status: DC

## 2018-08-25 NOTE — Progress Notes (Signed)
I saw and examined the patient with Dr. Jamse Mead and agree with assessment and plan as outlined.  Possible gout flare left ankle.  If pain persists, possibly MRI.  Out of work until January 20.

## 2018-08-25 NOTE — Progress Notes (Signed)
  Cheryl Baker - 63 y.o. female MRN [id number]  Date of birth: March 22, 1956    SUBJECTIVE:      Chief Complaint:/ HPI:  63 year old female presents with left lateral ankle pain for 2 days.  Patient denies any trauma to the ankle.  2 days ago she was walking around an ankle high boots with heels but denies any specific injury.  That evening she sprained significant ankle pain and had difficulty bearing weight or walking.  The last 2 days her pain is slightly improved and she is able to walk with a limp.  She notes lateral ankle swelling but denies any erythema.  No numbness or tingling.  No history of ankle injuries.  She does have a history of gout which typically affects the great toe.   ROS:     See HPI  PERTINENT  PMH / PSH FH / / SH:  Past Medical, Surgical, Social, and Family History Reviewed & Updated in the EMR.  Pertinent findings include:  History of gout  OBJECTIVE: There were no vitals taken for this visit.  Physical Exam:  Vital signs are reviewed.  GEN: Alert and oriented, NAD Pulm: Breathing unlabored PSY: normal mood, congruent affect  MSK: Left ankle: - Inspection: No bony deformity.  Swelling noted over the lateral ankle.  No erythema. - Palpation: Diffuse tenderness over the lateral ankle - Strength: 4/5 strength in all planes due to pain - ROM: Limited range of motion in all planes secondary to pain - Neuro/vasc: NV intact  MSK [place]: Limited ultrasound of the lateral left ankle shows no bony irregularities of the lateral malleolus.  There is slight tenosynovitis noted with the peroneal tendons.  There appears to be some thickening of the tendon sheath around the peroneal tendons.  Soft tissue edema noted  ASSESSMENT & PLAN:  1.  Left ankle pain-suspect her left ankle pain is due to gout flare.  There are no acute bony abnormalities or evidence of tendinous injury. - Prednisone daily x12 days -Colchicine 0.6 mg twice daily as needed -Follow-up as needed

## 2018-08-29 ENCOUNTER — Telehealth (INDEPENDENT_AMBULATORY_CARE_PROVIDER_SITE_OTHER): Payer: Self-pay | Admitting: Family Medicine

## 2018-08-29 ENCOUNTER — Other Ambulatory Visit (INDEPENDENT_AMBULATORY_CARE_PROVIDER_SITE_OTHER): Payer: Self-pay | Admitting: Family Medicine

## 2018-08-29 MED ORDER — INDOMETHACIN 50 MG PO CAPS: 50.0000 mg | ORAL_CAPSULE | Freq: Three times a day (TID) | ORAL | 1 refills | Status: DC | PRN

## 2018-08-29 NOTE — Telephone Encounter (Signed)
This is in answer to if aspirin is a true allergy. She is taking Prednisone and colchicine and asking what else she can do for the pain (see other message from today).

## 2018-08-29 NOTE — Telephone Encounter (Signed)
Patient returned call advised aspirin bothers her ulcer. Patient said the prednisone is bothering her stomach a little bit. Patient asked what can she take for pain? The number to contact patient is [phone]

## 2018-08-29 NOTE — Telephone Encounter (Signed)
She was given Prednisone taper and Colchicine. Please advise.

## 2018-08-29 NOTE — Telephone Encounter (Signed)
See other message from today on this.

## 2018-08-29 NOTE — Telephone Encounter (Signed)
Chart shows allergy to aspirin.  Is it a true allergy, or can she still take ibuprofen?

## 2018-08-29 NOTE — Telephone Encounter (Signed)
Patient called advised the medication is not working to help with the pain. Patient asked if she can take Advil or if Dr. Prince Rome will prescribe something else for pain? The number to contact patient is [phone]

## 2018-08-29 NOTE — Telephone Encounter (Signed)
Left message on patient's voice mail to call back with information on taking aspirin and ibuprofen.

## 2018-08-29 NOTE — Telephone Encounter (Signed)
Stop prednisone.  Will call in indomethacin.  Take it with food.

## 2018-08-29 NOTE — Telephone Encounter (Signed)
I called and advised the patient of the change in medication.  I emphasized the need to elevate the ankle (above heart level is best) when off her feet.  Because she said the pressure on her heel from the pillow makes the pain worse, I advised her to place the pillow(s) under the calf area with the foot over the edge (so there is no pressure directly on the heel). She will let us know if she fails to improve.

## 2018-10-03 ENCOUNTER — Telehealth (INDEPENDENT_AMBULATORY_CARE_PROVIDER_SITE_OTHER): Payer: Self-pay | Admitting: Family Medicine

## 2018-10-03 NOTE — Telephone Encounter (Signed)
I called and reached the patient's voice mail - left message to call back.

## 2018-10-03 NOTE — Telephone Encounter (Signed)
Pt called saying the back of her foot is super sore and she is limping.  She is requesting a call from Dr. Prince Rome assistant

## 2019-01-17 DIAGNOSIS — B351 Tinea unguium: Secondary | ICD-10-CM | POA: Diagnosis not present

## 2019-01-17 DIAGNOSIS — L600 Ingrowing nail: Secondary | ICD-10-CM | POA: Diagnosis not present

## 2019-01-17 DIAGNOSIS — M79675 Pain in left toe(s): Secondary | ICD-10-CM | POA: Diagnosis not present

## 2019-01-17 DIAGNOSIS — M79674 Pain in right toe(s): Secondary | ICD-10-CM | POA: Diagnosis not present

## 2019-01-31 DIAGNOSIS — L600 Ingrowing nail: Secondary | ICD-10-CM | POA: Diagnosis not present

## 2019-02-07 DIAGNOSIS — E782 Mixed hyperlipidemia: Secondary | ICD-10-CM | POA: Diagnosis not present

## 2019-02-07 DIAGNOSIS — E1169 Type 2 diabetes mellitus with other specified complication: Secondary | ICD-10-CM | POA: Diagnosis not present

## 2019-02-07 DIAGNOSIS — I10 Essential (primary) hypertension: Secondary | ICD-10-CM | POA: Diagnosis not present

## 2019-02-14 DIAGNOSIS — E1169 Type 2 diabetes mellitus with other specified complication: Secondary | ICD-10-CM | POA: Diagnosis not present

## 2019-02-14 DIAGNOSIS — I10 Essential (primary) hypertension: Secondary | ICD-10-CM | POA: Diagnosis not present

## 2019-02-14 DIAGNOSIS — E782 Mixed hyperlipidemia: Secondary | ICD-10-CM | POA: Diagnosis not present

## 2019-02-14 DIAGNOSIS — E669 Obesity, unspecified: Secondary | ICD-10-CM | POA: Diagnosis not present

## 2019-03-07 DIAGNOSIS — M79674 Pain in right toe(s): Secondary | ICD-10-CM | POA: Diagnosis not present

## 2019-03-07 DIAGNOSIS — L600 Ingrowing nail: Secondary | ICD-10-CM | POA: Diagnosis not present

## 2019-03-07 DIAGNOSIS — M79675 Pain in left toe(s): Secondary | ICD-10-CM | POA: Diagnosis not present

## 2019-03-07 DIAGNOSIS — B351 Tinea unguium: Secondary | ICD-10-CM | POA: Diagnosis not present

## 2019-03-29 ENCOUNTER — Other Ambulatory Visit: Payer: Self-pay | Admitting: Family Medicine

## 2019-03-29 DIAGNOSIS — Z1231 Encounter for screening mammogram for malignant neoplasm of breast: Secondary | ICD-10-CM

## 2019-04-04 DIAGNOSIS — M79675 Pain in left toe(s): Secondary | ICD-10-CM | POA: Diagnosis not present

## 2019-04-04 DIAGNOSIS — M79674 Pain in right toe(s): Secondary | ICD-10-CM | POA: Diagnosis not present

## 2019-04-04 DIAGNOSIS — B351 Tinea unguium: Secondary | ICD-10-CM | POA: Diagnosis not present

## 2019-04-04 DIAGNOSIS — L600 Ingrowing nail: Secondary | ICD-10-CM | POA: Diagnosis not present

## 2019-05-09 DIAGNOSIS — M79672 Pain in left foot: Secondary | ICD-10-CM | POA: Diagnosis not present

## 2019-05-09 DIAGNOSIS — M7732 Calcaneal spur, left foot: Secondary | ICD-10-CM | POA: Diagnosis not present

## 2019-05-09 DIAGNOSIS — M7662 Achilles tendinitis, left leg: Secondary | ICD-10-CM | POA: Diagnosis not present

## 2019-05-09 DIAGNOSIS — B351 Tinea unguium: Secondary | ICD-10-CM | POA: Diagnosis not present

## 2019-05-09 DIAGNOSIS — M722 Plantar fascial fibromatosis: Secondary | ICD-10-CM | POA: Diagnosis not present

## 2019-05-09 DIAGNOSIS — L600 Ingrowing nail: Secondary | ICD-10-CM | POA: Diagnosis not present

## 2019-05-09 DIAGNOSIS — M79671 Pain in right foot: Secondary | ICD-10-CM | POA: Diagnosis not present

## 2019-05-16 ENCOUNTER — Ambulatory Visit
Admission: RE | Admit: 2019-05-16 | Discharge: 2019-05-16 | Disposition: A | Discharge status: Home / Self Care | Payer: BC Managed Care – PPO | Source: Ambulatory Visit | Attending: Family Medicine | Admitting: Family Medicine

## 2019-05-16 ENCOUNTER — Other Ambulatory Visit: Payer: Self-pay

## 2019-05-16 DIAGNOSIS — Z1231 Encounter for screening mammogram for malignant neoplasm of breast: Secondary | ICD-10-CM | POA: Diagnosis not present

## 2019-05-23 DIAGNOSIS — M7662 Achilles tendinitis, left leg: Secondary | ICD-10-CM | POA: Diagnosis not present

## 2019-05-23 DIAGNOSIS — M71572 Other bursitis, not elsewhere classified, left ankle and foot: Secondary | ICD-10-CM | POA: Diagnosis not present

## 2019-06-06 DIAGNOSIS — L600 Ingrowing nail: Secondary | ICD-10-CM | POA: Diagnosis not present

## 2019-06-06 DIAGNOSIS — Z23 Encounter for immunization: Secondary | ICD-10-CM | POA: Diagnosis not present

## 2019-06-06 DIAGNOSIS — M79675 Pain in left toe(s): Secondary | ICD-10-CM | POA: Diagnosis not present

## 2019-06-06 DIAGNOSIS — M79674 Pain in right toe(s): Secondary | ICD-10-CM | POA: Diagnosis not present

## 2019-06-22 DIAGNOSIS — M7732 Calcaneal spur, left foot: Secondary | ICD-10-CM | POA: Diagnosis not present

## 2019-06-22 DIAGNOSIS — S86092A Other specified injury of left Achilles tendon, initial encounter: Secondary | ICD-10-CM | POA: Diagnosis not present

## 2019-06-26 ENCOUNTER — Ambulatory Visit: Payer: BC Managed Care – PPO | Admitting: Family Medicine

## 2019-07-06 ENCOUNTER — Other Ambulatory Visit: Payer: Self-pay | Admitting: Family Medicine

## 2019-07-06 DIAGNOSIS — E2839 Other primary ovarian failure: Secondary | ICD-10-CM

## 2019-07-09 DIAGNOSIS — H40013 Open angle with borderline findings, low risk, bilateral: Secondary | ICD-10-CM | POA: Diagnosis not present

## 2019-07-11 DIAGNOSIS — M7732 Calcaneal spur, left foot: Secondary | ICD-10-CM | POA: Diagnosis not present

## 2019-07-11 DIAGNOSIS — S86092D Other specified injury of left Achilles tendon, subsequent encounter: Secondary | ICD-10-CM | POA: Diagnosis not present

## 2019-07-16 DIAGNOSIS — L669 Cicatricial alopecia, unspecified: Secondary | ICD-10-CM | POA: Insufficient documentation

## 2019-07-18 ENCOUNTER — Other Ambulatory Visit: Payer: Self-pay

## 2019-07-18 ENCOUNTER — Ambulatory Visit
Admission: RE | Admit: 2019-07-18 | Discharge: 2019-07-18 | Disposition: A | Discharge status: Home / Self Care | Payer: BC Managed Care – PPO | Source: Ambulatory Visit | Attending: Family Medicine | Admitting: Family Medicine

## 2019-07-18 DIAGNOSIS — E2839 Other primary ovarian failure: Secondary | ICD-10-CM

## 2019-07-18 DIAGNOSIS — Z13820 Encounter for screening for osteoporosis: Secondary | ICD-10-CM | POA: Diagnosis not present

## 2019-07-18 DIAGNOSIS — Z780 Asymptomatic menopausal state: Secondary | ICD-10-CM | POA: Diagnosis not present

## 2019-07-25 DIAGNOSIS — M25572 Pain in left ankle and joints of left foot: Secondary | ICD-10-CM | POA: Diagnosis not present

## 2019-07-25 DIAGNOSIS — M65871 Other synovitis and tenosynovitis, right ankle and foot: Secondary | ICD-10-CM | POA: Diagnosis not present

## 2019-07-25 DIAGNOSIS — M25571 Pain in right ankle and joints of right foot: Secondary | ICD-10-CM | POA: Diagnosis not present

## 2019-07-25 DIAGNOSIS — H40013 Open angle with borderline findings, low risk, bilateral: Secondary | ICD-10-CM | POA: Diagnosis not present

## 2019-07-25 DIAGNOSIS — S86092D Other specified injury of left Achilles tendon, subsequent encounter: Secondary | ICD-10-CM | POA: Diagnosis not present

## 2019-07-27 DIAGNOSIS — R600 Localized edema: Secondary | ICD-10-CM | POA: Diagnosis not present

## 2019-08-08 DIAGNOSIS — M65872 Other synovitis and tenosynovitis, left ankle and foot: Secondary | ICD-10-CM | POA: Diagnosis not present

## 2019-08-08 DIAGNOSIS — S86092D Other specified injury of left Achilles tendon, subsequent encounter: Secondary | ICD-10-CM | POA: Diagnosis not present

## 2019-08-29 DIAGNOSIS — E1169 Type 2 diabetes mellitus with other specified complication: Secondary | ICD-10-CM | POA: Diagnosis not present

## 2019-08-29 DIAGNOSIS — Z0000 Encounter for general adult medical examination without abnormal findings: Secondary | ICD-10-CM | POA: Diagnosis not present

## 2019-08-29 DIAGNOSIS — Z23 Encounter for immunization: Secondary | ICD-10-CM | POA: Diagnosis not present

## 2019-08-29 DIAGNOSIS — E782 Mixed hyperlipidemia: Secondary | ICD-10-CM | POA: Diagnosis not present

## 2019-09-05 DIAGNOSIS — S86092D Other specified injury of left Achilles tendon, subsequent encounter: Secondary | ICD-10-CM | POA: Diagnosis not present

## 2019-09-05 DIAGNOSIS — M7732 Calcaneal spur, left foot: Secondary | ICD-10-CM | POA: Diagnosis not present

## 2019-10-03 ENCOUNTER — Ambulatory Visit (INDEPENDENT_AMBULATORY_CARE_PROVIDER_SITE_OTHER): Payer: BLUE CROSS/BLUE SHIELD

## 2019-10-03 ENCOUNTER — Other Ambulatory Visit: Payer: Self-pay | Admitting: Podiatry

## 2019-10-03 ENCOUNTER — Ambulatory Visit (INDEPENDENT_AMBULATORY_CARE_PROVIDER_SITE_OTHER): Payer: BLUE CROSS/BLUE SHIELD | Admitting: Podiatry

## 2019-10-03 ENCOUNTER — Other Ambulatory Visit: Payer: BC Managed Care – PPO

## 2019-10-03 ENCOUNTER — Other Ambulatory Visit: Payer: Self-pay

## 2019-10-03 ENCOUNTER — Encounter: Payer: Self-pay | Admitting: Podiatry

## 2019-10-03 VITALS — Temp 94.7°F

## 2019-10-03 DIAGNOSIS — M779 Enthesopathy, unspecified: Secondary | ICD-10-CM | POA: Diagnosis not present

## 2019-10-03 DIAGNOSIS — M7662 Achilles tendinitis, left leg: Secondary | ICD-10-CM

## 2019-10-03 DIAGNOSIS — M79671 Pain in right foot: Secondary | ICD-10-CM | POA: Diagnosis not present

## 2019-10-03 DIAGNOSIS — T148XXA Other injury of unspecified body region, initial encounter: Secondary | ICD-10-CM | POA: Diagnosis not present

## 2019-10-03 NOTE — Progress Notes (Signed)
   Subjective:    Patient ID: Cheryl Baker, female    DOB: 1956-08-07, 64 y.o.   MRN: [id number]  HPI    Review of Systems  All other systems reviewed and are negative.      Objective:   Physical Exam        Assessment & Plan:

## 2019-10-03 NOTE — Patient Instructions (Signed)

## 2019-10-03 NOTE — Progress Notes (Signed)
Subjective:   Patient ID: Cheryl Baker, female   DOB: 64 y.o.   MRN: [id number]   HPI Patient presents stating I think I tore the Achilles tendon left my sound of the doctor put me in a boot it is still bothering me a lot I feel weakness and I know I need physical therapy and I am concerned about rupture and whether it should be corrected.  Patient does not smoke likes to be active   Review of Systems  All other systems reviewed and are negative.       Objective:  Physical Exam Vitals and nursing note reviewed.  Constitutional:      Appearance: She is well-developed.  Pulmonary:     Effort: Pulmonary effort is normal.  Musculoskeletal:        General: Normal range of motion.  Skin:    General: Skin is warm.  Neurological:     Mental Status: She is alert.     Neurovascular status intact muscle strength adequate range of motion within normal limits with what appears to be weakness of the Achilles tendon left with the possibility for complete rupture or possibility for partial rupture that occurred.  I did not note significant function of the tendon when tested and I am concerned about her ability to walk and she continues to use boot and has some muscle weakness of the calf muscle     Assessment:  Difficult to make complete determination with probability for either partial or full rupture Achilles left     Plan:  H&P condition and x-ray reviewed and at this point I have recommended MRI to try to understand better the pathology present and whether or not there is complete rupture.  I also went ahead and we will schedule her for physical therapy to try to strengthen her and I discussed possible repair of the Achilles depending on response  X-ray indicates large posterior spur no other indications of pathology

## 2019-10-08 ENCOUNTER — Telehealth: Payer: Self-pay | Admitting: *Deleted

## 2019-10-08 NOTE — Telephone Encounter (Signed)
Called patient's insurance Chief Technology Officer) to get a pre-certification for patient to get an:  MRI Foot Left WO Contrast  [employer] gave me a reference #: [id number]   They told me with this patient's surgery, that I had to go through:  [employer] - Radiology Vendor for this plan Phone # [phone]  Pre-certification was approved from 10/08/19 through 11/06/19  Order #: [id number]  Patient will be referred to:  [facility] [address] [place], [place] [phone]

## 2019-10-17 DIAGNOSIS — M25572 Pain in left ankle and joints of left foot: Secondary | ICD-10-CM | POA: Diagnosis not present

## 2019-10-17 DIAGNOSIS — M25472 Effusion, left ankle: Secondary | ICD-10-CM | POA: Diagnosis not present

## 2019-10-17 DIAGNOSIS — M25672 Stiffness of left ankle, not elsewhere classified: Secondary | ICD-10-CM | POA: Diagnosis not present

## 2019-10-17 DIAGNOSIS — M25372 Other instability, left ankle: Secondary | ICD-10-CM | POA: Diagnosis not present

## 2019-10-25 DIAGNOSIS — M25572 Pain in left ankle and joints of left foot: Secondary | ICD-10-CM | POA: Diagnosis not present

## 2019-10-25 DIAGNOSIS — M25672 Stiffness of left ankle, not elsewhere classified: Secondary | ICD-10-CM | POA: Diagnosis not present

## 2019-10-25 DIAGNOSIS — M25472 Effusion, left ankle: Secondary | ICD-10-CM | POA: Diagnosis not present

## 2019-10-25 DIAGNOSIS — M25372 Other instability, left ankle: Secondary | ICD-10-CM | POA: Diagnosis not present

## 2019-10-29 ENCOUNTER — Telehealth: Payer: Self-pay | Admitting: *Deleted

## 2019-10-29 DIAGNOSIS — M25472 Effusion, left ankle: Secondary | ICD-10-CM | POA: Diagnosis not present

## 2019-10-29 DIAGNOSIS — M25672 Stiffness of left ankle, not elsewhere classified: Secondary | ICD-10-CM | POA: Diagnosis not present

## 2019-10-29 DIAGNOSIS — M25572 Pain in left ankle and joints of left foot: Secondary | ICD-10-CM | POA: Diagnosis not present

## 2019-10-29 DIAGNOSIS — M25372 Other instability, left ankle: Secondary | ICD-10-CM | POA: Diagnosis not present

## 2019-10-29 NOTE — Telephone Encounter (Signed)
Patient was seen by Dr. Charlsie Merles on 10/03/19,now she is experiencing cramping in left foot.  She is requesting a muscle relaxer and would like the prescription sent to pharmacy on file [facility]).MRI is also scheduled for 10/31/19.

## 2019-10-29 NOTE — Telephone Encounter (Signed)
I called the patient back. She had this issue before when she originally had the injury and she has started PT. She gets some cramping more to the heel and occasionally the calf. There is no swelling and no warmth. Discussed signs and symptoms of DVT. She states she had an ultrasound previously (although I do not have access to this) that ruled out a blood clot. She states this feels the same as it did previously. She has duexis at home and se is going to start with that and ice. If no improvement then will re-order a muscle relaxant. She wants to try this first. Encouraged to call back with any questions or concerns.

## 2019-10-30 DIAGNOSIS — M25672 Stiffness of left ankle, not elsewhere classified: Secondary | ICD-10-CM | POA: Diagnosis not present

## 2019-10-30 DIAGNOSIS — M25572 Pain in left ankle and joints of left foot: Secondary | ICD-10-CM | POA: Diagnosis not present

## 2019-10-30 DIAGNOSIS — M25372 Other instability, left ankle: Secondary | ICD-10-CM | POA: Diagnosis not present

## 2019-10-30 DIAGNOSIS — M25472 Effusion, left ankle: Secondary | ICD-10-CM | POA: Diagnosis not present

## 2019-10-31 ENCOUNTER — Other Ambulatory Visit: Payer: Self-pay

## 2019-10-31 ENCOUNTER — Ambulatory Visit
Admission: RE | Admit: 2019-10-31 | Discharge: 2019-10-31 | Disposition: A | Discharge status: Home / Self Care | Payer: BC Managed Care – PPO | Source: Ambulatory Visit | Attending: Podiatry | Admitting: Podiatry

## 2019-10-31 DIAGNOSIS — M7662 Achilles tendinitis, left leg: Secondary | ICD-10-CM

## 2019-10-31 DIAGNOSIS — S86012A Strain of left Achilles tendon, initial encounter: Secondary | ICD-10-CM | POA: Diagnosis not present

## 2019-10-31 DIAGNOSIS — T148XXA Other injury of unspecified body region, initial encounter: Secondary | ICD-10-CM

## 2019-11-06 NOTE — Progress Notes (Signed)
Do you want this case or should I refer to another Doc. Didn't realize I had two cases

## 2019-11-14 ENCOUNTER — Ambulatory Visit (INDEPENDENT_AMBULATORY_CARE_PROVIDER_SITE_OTHER): Payer: BC Managed Care – PPO | Admitting: Podiatry

## 2019-11-14 ENCOUNTER — Other Ambulatory Visit: Payer: Self-pay

## 2019-11-14 ENCOUNTER — Encounter: Payer: Self-pay | Admitting: Podiatry

## 2019-11-14 VITALS — Temp 97.8°F

## 2019-11-14 DIAGNOSIS — T148XXA Other injury of unspecified body region, initial encounter: Secondary | ICD-10-CM | POA: Diagnosis not present

## 2019-11-14 DIAGNOSIS — M25572 Pain in left ankle and joints of left foot: Secondary | ICD-10-CM | POA: Diagnosis not present

## 2019-11-14 DIAGNOSIS — M7662 Achilles tendinitis, left leg: Secondary | ICD-10-CM | POA: Diagnosis not present

## 2019-11-14 DIAGNOSIS — M25372 Other instability, left ankle: Secondary | ICD-10-CM | POA: Diagnosis not present

## 2019-11-14 DIAGNOSIS — M25472 Effusion, left ankle: Secondary | ICD-10-CM | POA: Diagnosis not present

## 2019-11-14 DIAGNOSIS — M25672 Stiffness of left ankle, not elsewhere classified: Secondary | ICD-10-CM | POA: Diagnosis not present

## 2019-11-14 NOTE — Progress Notes (Signed)
Subjective:   Patient ID: Cheryl Baker, female   DOB: 64 y.o.   MRN: [id number]   HPI Patient states she feels like physical therapy is helping and she still has weakness in the back of the heel and midfoot and still has a lot of pain and has not gone without her boot yet   ROS      Objective:  Physical Exam  Neurovascular status intact with patient having what appears to be function of the Achilles tendon left but does have some deficit in the mid substance area with quite a bit of discomfort swelling present     Assessment:  Probability for tear interstitial of the Achilles tendon left confirmed on MRI with continued inflammation pain with moderate functional improvement     Plan:  Reviewed condition discussed surgery now versus waiting and she wants to wait.  She wants to get me the notes from Dr. Grandville Silos which treated her originally and we will continue the boot gradually try to reduce it over the next 2 to 4 weeks and see back 6 weeks and decide whether or not surgery is necessary and has had a great deal of time with her today going over surgery and what might be necessary

## 2019-11-28 DIAGNOSIS — M25372 Other instability, left ankle: Secondary | ICD-10-CM | POA: Diagnosis not present

## 2019-11-28 DIAGNOSIS — M25672 Stiffness of left ankle, not elsewhere classified: Secondary | ICD-10-CM | POA: Diagnosis not present

## 2019-11-28 DIAGNOSIS — M25572 Pain in left ankle and joints of left foot: Secondary | ICD-10-CM | POA: Diagnosis not present

## 2019-11-28 DIAGNOSIS — M25472 Effusion, left ankle: Secondary | ICD-10-CM | POA: Diagnosis not present

## 2019-12-05 ENCOUNTER — Telehealth: Payer: Self-pay | Admitting: Podiatry

## 2019-12-05 NOTE — Telephone Encounter (Signed)
Pt called wanting to know if there was anything that Dr/.Regal can provide for extra support in her tennis shoe since she is no longer in the boot. stll having some weakness with the heel please advise

## 2019-12-06 ENCOUNTER — Telehealth: Payer: Self-pay | Admitting: *Deleted

## 2019-12-06 NOTE — Telephone Encounter (Signed)
Left a voicemail message for patient that I left her at the front desk Felt Heel Lifts that Dr. Charlsie Merles recommended for her to try for the weakness she is having in her heels.  Patient can pick up at the front desk when she wants to stop by and get them.

## 2019-12-12 DIAGNOSIS — M25572 Pain in left ankle and joints of left foot: Secondary | ICD-10-CM | POA: Diagnosis not present

## 2019-12-12 DIAGNOSIS — M25372 Other instability, left ankle: Secondary | ICD-10-CM | POA: Diagnosis not present

## 2019-12-12 DIAGNOSIS — M25472 Effusion, left ankle: Secondary | ICD-10-CM | POA: Diagnosis not present

## 2019-12-12 DIAGNOSIS — M25672 Stiffness of left ankle, not elsewhere classified: Secondary | ICD-10-CM | POA: Diagnosis not present

## 2019-12-19 DIAGNOSIS — M25472 Effusion, left ankle: Secondary | ICD-10-CM | POA: Diagnosis not present

## 2019-12-19 DIAGNOSIS — M25372 Other instability, left ankle: Secondary | ICD-10-CM | POA: Diagnosis not present

## 2019-12-19 DIAGNOSIS — M25572 Pain in left ankle and joints of left foot: Secondary | ICD-10-CM | POA: Diagnosis not present

## 2019-12-19 DIAGNOSIS — M25672 Stiffness of left ankle, not elsewhere classified: Secondary | ICD-10-CM | POA: Diagnosis not present

## 2019-12-21 DIAGNOSIS — S86009A Unspecified injury of unspecified Achilles tendon, initial encounter: Secondary | ICD-10-CM | POA: Diagnosis not present

## 2019-12-26 ENCOUNTER — Ambulatory Visit: Payer: BC Managed Care – PPO | Admitting: Podiatry

## 2020-01-02 DIAGNOSIS — M7662 Achilles tendinitis, left leg: Secondary | ICD-10-CM | POA: Diagnosis not present

## 2020-01-10 DIAGNOSIS — W1839XS Other fall on same level, sequela: Secondary | ICD-10-CM | POA: Diagnosis not present

## 2020-01-10 DIAGNOSIS — M7732 Calcaneal spur, left foot: Secondary | ICD-10-CM | POA: Diagnosis not present

## 2020-01-10 DIAGNOSIS — M7660 Achilles tendinitis, unspecified leg: Secondary | ICD-10-CM | POA: Diagnosis not present

## 2020-01-10 DIAGNOSIS — S86012S Strain of left Achilles tendon, sequela: Secondary | ICD-10-CM | POA: Diagnosis not present

## 2020-01-16 DIAGNOSIS — H40013 Open angle with borderline findings, low risk, bilateral: Secondary | ICD-10-CM | POA: Diagnosis not present

## 2020-03-31 DIAGNOSIS — L669 Cicatricial alopecia, unspecified: Secondary | ICD-10-CM | POA: Diagnosis not present

## 2020-04-07 DIAGNOSIS — Z20822 Contact with and (suspected) exposure to covid-19: Secondary | ICD-10-CM | POA: Diagnosis not present

## 2020-04-08 DIAGNOSIS — M6702 Short Achilles tendon (acquired), left ankle: Secondary | ICD-10-CM | POA: Diagnosis not present

## 2020-04-08 DIAGNOSIS — S86012S Strain of left Achilles tendon, sequela: Secondary | ICD-10-CM | POA: Diagnosis not present

## 2020-04-08 DIAGNOSIS — M24575 Contracture, left foot: Secondary | ICD-10-CM | POA: Diagnosis not present

## 2020-04-08 DIAGNOSIS — X58XXXS Exposure to other specified factors, sequela: Secondary | ICD-10-CM | POA: Diagnosis not present

## 2020-04-08 DIAGNOSIS — Z79899 Other long term (current) drug therapy: Secondary | ICD-10-CM | POA: Diagnosis not present

## 2020-04-08 DIAGNOSIS — M67874 Other specified disorders of tendon, left ankle and foot: Secondary | ICD-10-CM | POA: Diagnosis not present

## 2020-04-08 DIAGNOSIS — M7662 Achilles tendinitis, left leg: Secondary | ICD-10-CM | POA: Diagnosis not present

## 2020-04-08 DIAGNOSIS — I10 Essential (primary) hypertension: Secondary | ICD-10-CM | POA: Diagnosis not present

## 2020-04-08 DIAGNOSIS — E119 Type 2 diabetes mellitus without complications: Secondary | ICD-10-CM | POA: Diagnosis not present

## 2020-04-09 DIAGNOSIS — X58XXXS Exposure to other specified factors, sequela: Secondary | ICD-10-CM | POA: Diagnosis not present

## 2020-04-09 DIAGNOSIS — I10 Essential (primary) hypertension: Secondary | ICD-10-CM | POA: Diagnosis not present

## 2020-04-09 DIAGNOSIS — M24575 Contracture, left foot: Secondary | ICD-10-CM | POA: Diagnosis not present

## 2020-04-09 DIAGNOSIS — S86012S Strain of left Achilles tendon, sequela: Secondary | ICD-10-CM | POA: Diagnosis not present

## 2020-04-09 DIAGNOSIS — E119 Type 2 diabetes mellitus without complications: Secondary | ICD-10-CM | POA: Diagnosis not present

## 2020-04-09 DIAGNOSIS — M7662 Achilles tendinitis, left leg: Secondary | ICD-10-CM | POA: Diagnosis not present

## 2020-04-09 DIAGNOSIS — Z79899 Other long term (current) drug therapy: Secondary | ICD-10-CM | POA: Diagnosis not present

## 2020-04-28 DIAGNOSIS — M7660 Achilles tendinitis, unspecified leg: Secondary | ICD-10-CM | POA: Diagnosis not present

## 2020-05-22 DIAGNOSIS — M7660 Achilles tendinitis, unspecified leg: Secondary | ICD-10-CM | POA: Diagnosis not present

## 2020-05-22 DIAGNOSIS — Z4789 Encounter for other orthopedic aftercare: Secondary | ICD-10-CM | POA: Diagnosis not present

## 2020-05-29 ENCOUNTER — Other Ambulatory Visit: Payer: Self-pay | Admitting: Family Medicine

## 2020-05-29 DIAGNOSIS — Z1231 Encounter for screening mammogram for malignant neoplasm of breast: Secondary | ICD-10-CM

## 2020-06-09 DIAGNOSIS — M19072 Primary osteoarthritis, left ankle and foot: Secondary | ICD-10-CM | POA: Diagnosis not present

## 2020-06-09 DIAGNOSIS — W1839XD Other fall on same level, subsequent encounter: Secondary | ICD-10-CM | POA: Diagnosis not present

## 2020-06-09 DIAGNOSIS — M85872 Other specified disorders of bone density and structure, left ankle and foot: Secondary | ICD-10-CM | POA: Diagnosis not present

## 2020-06-09 DIAGNOSIS — S86012D Strain of left Achilles tendon, subsequent encounter: Secondary | ICD-10-CM | POA: Diagnosis not present

## 2020-06-19 DIAGNOSIS — M7662 Achilles tendinitis, left leg: Secondary | ICD-10-CM | POA: Diagnosis not present

## 2020-06-27 DIAGNOSIS — M7662 Achilles tendinitis, left leg: Secondary | ICD-10-CM | POA: Diagnosis not present

## 2020-07-01 DIAGNOSIS — M7662 Achilles tendinitis, left leg: Secondary | ICD-10-CM | POA: Diagnosis not present

## 2020-07-03 DIAGNOSIS — M7662 Achilles tendinitis, left leg: Secondary | ICD-10-CM | POA: Diagnosis not present

## 2020-07-07 DIAGNOSIS — M7662 Achilles tendinitis, left leg: Secondary | ICD-10-CM | POA: Diagnosis not present

## 2020-07-09 DIAGNOSIS — M7662 Achilles tendinitis, left leg: Secondary | ICD-10-CM | POA: Diagnosis not present

## 2020-07-14 DIAGNOSIS — M7660 Achilles tendinitis, unspecified leg: Secondary | ICD-10-CM | POA: Diagnosis not present

## 2020-07-14 DIAGNOSIS — Z4789 Encounter for other orthopedic aftercare: Secondary | ICD-10-CM | POA: Diagnosis not present

## 2020-07-17 DIAGNOSIS — H40013 Open angle with borderline findings, low risk, bilateral: Secondary | ICD-10-CM | POA: Diagnosis not present

## 2020-07-23 DIAGNOSIS — M7662 Achilles tendinitis, left leg: Secondary | ICD-10-CM | POA: Diagnosis not present

## 2020-07-25 DIAGNOSIS — M7662 Achilles tendinitis, left leg: Secondary | ICD-10-CM | POA: Diagnosis not present

## 2020-07-29 DIAGNOSIS — M7662 Achilles tendinitis, left leg: Secondary | ICD-10-CM | POA: Diagnosis not present

## 2020-07-31 DIAGNOSIS — M7662 Achilles tendinitis, left leg: Secondary | ICD-10-CM | POA: Diagnosis not present

## 2020-08-05 DIAGNOSIS — M7662 Achilles tendinitis, left leg: Secondary | ICD-10-CM | POA: Diagnosis not present

## 2020-08-13 ENCOUNTER — Ambulatory Visit: Payer: BC Managed Care – PPO

## 2020-08-13 DIAGNOSIS — M7662 Achilles tendinitis, left leg: Secondary | ICD-10-CM | POA: Diagnosis not present

## 2020-08-18 DIAGNOSIS — W1839XD Other fall on same level, subsequent encounter: Secondary | ICD-10-CM | POA: Diagnosis not present

## 2020-08-18 DIAGNOSIS — M7660 Achilles tendinitis, unspecified leg: Secondary | ICD-10-CM | POA: Diagnosis not present

## 2020-08-18 DIAGNOSIS — S86012S Strain of left Achilles tendon, sequela: Secondary | ICD-10-CM | POA: Diagnosis not present

## 2020-08-18 DIAGNOSIS — M79672 Pain in left foot: Secondary | ICD-10-CM | POA: Diagnosis not present

## 2020-08-20 DIAGNOSIS — M7662 Achilles tendinitis, left leg: Secondary | ICD-10-CM | POA: Diagnosis not present

## 2020-08-27 DIAGNOSIS — M7662 Achilles tendinitis, left leg: Secondary | ICD-10-CM | POA: Diagnosis not present

## 2020-09-10 DIAGNOSIS — Z23 Encounter for immunization: Secondary | ICD-10-CM | POA: Diagnosis not present

## 2020-09-10 DIAGNOSIS — I10 Essential (primary) hypertension: Secondary | ICD-10-CM | POA: Diagnosis not present

## 2020-09-10 DIAGNOSIS — E782 Mixed hyperlipidemia: Secondary | ICD-10-CM | POA: Diagnosis not present

## 2020-09-10 DIAGNOSIS — E1169 Type 2 diabetes mellitus with other specified complication: Secondary | ICD-10-CM | POA: Diagnosis not present

## 2020-09-10 DIAGNOSIS — M7662 Achilles tendinitis, left leg: Secondary | ICD-10-CM | POA: Diagnosis not present

## 2020-09-10 DIAGNOSIS — Z0000 Encounter for general adult medical examination without abnormal findings: Secondary | ICD-10-CM | POA: Diagnosis not present

## 2020-09-17 DIAGNOSIS — M7662 Achilles tendinitis, left leg: Secondary | ICD-10-CM | POA: Diagnosis not present

## 2020-09-24 DIAGNOSIS — M7662 Achilles tendinitis, left leg: Secondary | ICD-10-CM | POA: Diagnosis not present

## 2020-09-29 DIAGNOSIS — L669 Cicatricial alopecia, unspecified: Secondary | ICD-10-CM | POA: Diagnosis not present

## 2020-10-01 DIAGNOSIS — M7662 Achilles tendinitis, left leg: Secondary | ICD-10-CM | POA: Diagnosis not present

## 2020-10-02 DIAGNOSIS — M79672 Pain in left foot: Secondary | ICD-10-CM | POA: Diagnosis not present

## 2020-10-02 DIAGNOSIS — Z4789 Encounter for other orthopedic aftercare: Secondary | ICD-10-CM | POA: Diagnosis not present

## 2020-10-02 DIAGNOSIS — M7660 Achilles tendinitis, unspecified leg: Secondary | ICD-10-CM | POA: Diagnosis not present

## 2020-10-08 DIAGNOSIS — M7662 Achilles tendinitis, left leg: Secondary | ICD-10-CM | POA: Diagnosis not present

## 2020-10-15 DIAGNOSIS — M7662 Achilles tendinitis, left leg: Secondary | ICD-10-CM | POA: Diagnosis not present

## 2020-10-22 ENCOUNTER — Encounter (INDEPENDENT_AMBULATORY_CARE_PROVIDER_SITE_OTHER): Payer: Self-pay | Admitting: Otolaryngology

## 2020-10-22 ENCOUNTER — Other Ambulatory Visit: Payer: Self-pay

## 2020-10-22 ENCOUNTER — Ambulatory Visit (INDEPENDENT_AMBULATORY_CARE_PROVIDER_SITE_OTHER): Payer: BC Managed Care – PPO | Admitting: Otolaryngology

## 2020-10-22 VITALS — Temp 96.3°F

## 2020-10-22 DIAGNOSIS — H6122 Impacted cerumen, left ear: Secondary | ICD-10-CM | POA: Diagnosis not present

## 2020-10-22 DIAGNOSIS — M7662 Achilles tendinitis, left leg: Secondary | ICD-10-CM | POA: Diagnosis not present

## 2020-10-22 NOTE — Progress Notes (Signed)
HPI: Cheryl Baker is a 65 y.o. female who presents for evaluation of sensation of something in her left ear.  At times she has some difficulty hearing.  This is been going on since the first of the year.  Past Medical History:  Diagnosis Date  . Diabetes mellitus without complication (HCC)   . Hyperlipidemia   . Hypertension    Past Surgical History:  Procedure Laterality Date  . ABDOMINAL HYSTERECTOMY    . ANKLE SURGERY     Social History   Socioeconomic History  . Marital status: Married    Spouse name: Not on file  . Number of children: Not on file  . Years of education: Not on file  . Highest education level: Not on file  Occupational History  . Not on file  Tobacco Use  . Smoking status: Never Smoker  . Smokeless tobacco: Never Used  Substance and Sexual Activity  . Alcohol use: No  . Drug use: No  . Sexual activity: Not on file  Other Topics Concern  . Not on file  Social History Narrative  . Not on file   Social Determinants of Health   Financial Resource Strain: Not on file  Food Insecurity: Not on file  Transportation Needs: Not on file  Physical Activity: Not on file  Stress: Not on file  Social Connections: Not on file   No family history on file. Allergies  Allergen Reactions  . Aspirin     High doses of aspirin irritate her stomach ulcer  . Penicillins   . Prednisone Nausea Only   Prior to Admission medications   Medication Sig Start Date End Date Taking? Authorizing Provider  amLODipine (NORVASC) 5 MG tablet Take 5 mg by mouth daily. 06/13/19   [provider]  Ascorbic Acid (VITAMIN C PO) Take 1 tablet by mouth daily.    [provider]  aspirin EC 81 MG tablet Take 81 mg by mouth daily.    [provider]  atorvastatin (LIPITOR) 40 MG tablet Take 40 mg by mouth daily. 06/13/19   [provider]  colchicine 0.6 MG tablet Take 1 tablet (0.6 mg total) by mouth 2 (two) times daily as needed. 08/25/18    Hilts, Casimiro Needle, MD  hydrochlorothiazide (HYDRODIURIL) 25 MG tablet Take 25 mg by mouth daily. 10/28/19   [provider]  indomethacin (INDOCIN) 50 MG capsule Take 1 capsule (50 mg total) by mouth 3 (three) times daily as needed. 08/29/18   Hilts, Casimiro Needle, MD  methocarbamol (ROBAXIN) 750 MG tablet Take 750 mg by mouth 4 (four) times daily. 07/11/19   [provider]  potassium chloride SA (KLOR-CON) 20 MEQ tablet Take 20 mEq by mouth as needed.    [provider]     Positive ROS: Otherwise negative  All other systems have been reviewed and were otherwise negative with the exception of those mentioned in the HPI and as above.  Physical Exam: Constitutional: Alert, well-appearing, no acute distress Ears: External ears without lesions or tenderness. Ear canals are clear bilaterally.  She had a few hairs down in the left ear canal that was cleaned with hydroperoxide and suction.  TMs were clear bilaterally with good mobility on pneumatic otoscopy.  On hearing screening with the tuning forks she had good hearing in both ears with symmetric hearing. Nasal: External nose without lesions. Septum with minimal deformity.. Clear nasal passages Oral: Lips and gums without lesions. Tongue and palate mucosa without lesions. Posterior oropharynx clear. Neck:  No palpable adenopathy or masses Respiratory: Breathing comfortably  Skin: No facial/neck lesions or rash noted.  Procedures  Assessment: Left ear sensation with minimal findings and normal hearing screening.  Plan: Left ear was cleaned in the office today with hydroperoxide and suction.  Hearing was otherwise normal on tuning fork testing.  She will follow-up as needed.  Narda Bonds, MD

## 2020-10-29 DIAGNOSIS — M7662 Achilles tendinitis, left leg: Secondary | ICD-10-CM | POA: Diagnosis not present

## 2020-11-05 DIAGNOSIS — M7662 Achilles tendinitis, left leg: Secondary | ICD-10-CM | POA: Diagnosis not present

## 2020-11-12 DIAGNOSIS — M7662 Achilles tendinitis, left leg: Secondary | ICD-10-CM | POA: Diagnosis not present

## 2020-12-10 DIAGNOSIS — M7732 Calcaneal spur, left foot: Secondary | ICD-10-CM | POA: Diagnosis not present

## 2020-12-10 DIAGNOSIS — M7660 Achilles tendinitis, unspecified leg: Secondary | ICD-10-CM | POA: Diagnosis not present

## 2020-12-10 DIAGNOSIS — M6788 Other specified disorders of synovium and tendon, other site: Secondary | ICD-10-CM | POA: Diagnosis not present

## 2020-12-10 DIAGNOSIS — M25472 Effusion, left ankle: Secondary | ICD-10-CM | POA: Diagnosis not present

## 2020-12-24 ENCOUNTER — Ambulatory Visit
Admission: RE | Admit: 2020-12-24 | Discharge: 2020-12-24 | Disposition: A | Discharge status: Home / Self Care | Payer: BC Managed Care – PPO | Source: Ambulatory Visit | Attending: Family Medicine | Admitting: Family Medicine

## 2020-12-24 ENCOUNTER — Other Ambulatory Visit: Payer: Self-pay

## 2020-12-24 DIAGNOSIS — Z1231 Encounter for screening mammogram for malignant neoplasm of breast: Secondary | ICD-10-CM | POA: Diagnosis not present

## 2021-02-17 DIAGNOSIS — H40013 Open angle with borderline findings, low risk, bilateral: Secondary | ICD-10-CM | POA: Diagnosis not present

## 2021-02-19 DIAGNOSIS — S86012S Strain of left Achilles tendon, sequela: Secondary | ICD-10-CM | POA: Diagnosis not present

## 2021-02-19 DIAGNOSIS — M6788 Other specified disorders of synovium and tendon, other site: Secondary | ICD-10-CM | POA: Diagnosis not present

## 2021-02-19 DIAGNOSIS — M79672 Pain in left foot: Secondary | ICD-10-CM | POA: Diagnosis not present

## 2021-02-19 DIAGNOSIS — M7660 Achilles tendinitis, unspecified leg: Secondary | ICD-10-CM | POA: Diagnosis not present

## 2021-03-09 ENCOUNTER — Ambulatory Visit: Payer: BC Managed Care – PPO | Admitting: Family Medicine

## 2021-03-10 ENCOUNTER — Encounter: Payer: Self-pay | Admitting: Family Medicine

## 2021-03-10 ENCOUNTER — Ambulatory Visit (INDEPENDENT_AMBULATORY_CARE_PROVIDER_SITE_OTHER): Payer: BC Managed Care – PPO | Admitting: Family Medicine

## 2021-03-10 ENCOUNTER — Ambulatory Visit: Payer: Self-pay

## 2021-03-10 ENCOUNTER — Other Ambulatory Visit: Payer: Self-pay

## 2021-03-10 DIAGNOSIS — M25561 Pain in right knee: Secondary | ICD-10-CM

## 2021-03-10 MED ORDER — CELECOXIB 100 MG PO CAPS: 100.0000 mg | ORAL_CAPSULE | Freq: Two times a day (BID) | ORAL | 3 refills | Status: DC | PRN

## 2021-03-10 MED ORDER — COLCHICINE 0.6 MG PO CAPS: 1.0000 | ORAL_CAPSULE | Freq: Two times a day (BID) | ORAL | 3 refills | Status: AC | PRN

## 2021-03-10 NOTE — Progress Notes (Signed)
   Office Visit Note   Patient: Cheryl Baker           Date of Birth: 25-May-1956           MRN: [id number] Visit Date: 03/10/2021 Requested by: Lupita Raider, MD [address] [place],  [place] [address] PCP: Lupita Raider, MD  Subjective: Chief Complaint  Patient presents with   Right Knee - Pain    Pain in the knee since 02/17/21. This was the day after she helped transfer her father from a wheelchair to a chaise. She did not have pain immediately - started the next day. Took advil and rested. The pain returned and she took gout medicine for at least a week, in case that was the issue. Did not help.    HPI: She is here with right knee pain.  Around July 5 her father was visiting and she had to help him transfer from a wheelchair, she recalls feeling discomfort in her knee but it was not severe.  The next day her knee became more painful and has been stiff and swollen since then.  She tried indomethacin for a possible gout attack, but that did not help.  She continues to walk with a limp and the pain sometimes makes it hard to sleep at night.  She took ibuprofen yesterday with some improvement.                ROS:   All other systems were reviewed and are negative.  Objective: Vital Signs: There were no vitals taken for this visit.  Physical Exam:  General:  Alert and oriented, in no acute distress. Pulm:  Breathing unlabored. Psy:  Normal mood, congruent affect. Skin: No erythema Right knee: 1+ effusion with no warmth.  She has full active extension and flexion of 120 degrees.  Ligaments are stable.  She is tender on the medial joint line and has pain but no palpable click with McMurray's.    Imaging: XR Knee 1-2 Views Right  Result Date: 03/10/2021 X-rays reveal mild to moderate joint space narrowing of the medial compartment consistent with DJD.  No obvious loose body or stress fracture.   Assessment & Plan: Right knee pain, possibilities include  aggravation of DJD versus degenerative medial meniscus tear -She will try indomethacin for 5 to 7 days.  If that does not help, then colchicine and Celebrex.  If still no relief, then possibly MRI scan versus a one-time cortisone injection.  She would like to avoid injection if possible.     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 No pertinent family history.  Past Surgical History:  Procedure Laterality Date   ABDOMINAL HYSTERECTOMY     ANKLE SURGERY     Social History   Occupational History   Not on file  Tobacco Use   Smoking status: Never   Smokeless tobacco: Never  Substance and Sexual Activity   Alcohol use: No   Drug use: No   Sexual activity: Not on file

## 2021-03-10 NOTE — Patient Instructions (Signed)
   Glucosamine Sulfate:  1,000 mg twice daily  Turmeric:  500 mg twice daily   

## 2021-03-11 ENCOUNTER — Telehealth: Payer: Self-pay | Admitting: Family Medicine

## 2021-03-11 NOTE — Telephone Encounter (Signed)
Pt wondering if she can get a sleeve to put over her knee or something for comfort? She wants a call back   CB [phone]

## 2021-03-11 NOTE — Telephone Encounter (Signed)
I called her. She will come in next Wednesday 03/18/21 (1:00) to see me for a hinged knee brace fitting - that is her next day off from work.

## 2021-03-11 NOTE — Telephone Encounter (Signed)
Please advise 

## 2021-03-18 ENCOUNTER — Other Ambulatory Visit: Payer: Self-pay

## 2021-03-18 ENCOUNTER — Ambulatory Visit (INDEPENDENT_AMBULATORY_CARE_PROVIDER_SITE_OTHER): Payer: BC Managed Care – PPO

## 2021-03-18 DIAGNOSIS — M25561 Pain in right knee: Secondary | ICD-10-CM

## 2021-03-18 NOTE — Progress Notes (Signed)
Patient came in today to be fitted for a hinged knee brace -- size medium given. Also, demonstrated wrapping the knee with an ACE wrap, for days she just needs a bit of compression on the knee, to go along with rest, ice and elevation. 4" ACE wrap given. Advised her to give [place] a call if she has any questions/concerns, or fails to improve.

## 2021-03-24 DIAGNOSIS — E782 Mixed hyperlipidemia: Secondary | ICD-10-CM | POA: Diagnosis not present

## 2021-03-24 DIAGNOSIS — E1169 Type 2 diabetes mellitus with other specified complication: Secondary | ICD-10-CM | POA: Diagnosis not present

## 2021-03-24 DIAGNOSIS — I10 Essential (primary) hypertension: Secondary | ICD-10-CM | POA: Diagnosis not present

## 2021-03-30 DIAGNOSIS — L669 Cicatricial alopecia, unspecified: Secondary | ICD-10-CM | POA: Diagnosis not present

## 2021-03-30 DIAGNOSIS — L658 Other specified nonscarring hair loss: Secondary | ICD-10-CM | POA: Diagnosis not present

## 2021-04-13 ENCOUNTER — Telehealth: Payer: Self-pay | Admitting: Orthopedic Surgery

## 2021-04-13 NOTE — Telephone Encounter (Signed)
Pt called stating she was a pt of Dr. Prince Rome for her R knee. Pt states before he left he told her if her knee pain didn't get better she would have to get an MRI. Pts knee hasn't gotten any better and she would like to know does she need to be seen in the office before we can send that referral? Or can Dr. Lajoyce Corners look at Dr. Prince Rome notes and send the referral in?

## 2021-04-13 NOTE — Telephone Encounter (Signed)
Called pt and asked if she would like to get set for an injection first, and pt states she doesn't want to due to being told she has fluid on her knee. Pt states she would like to have the referral sent in and would like a CB to be updated.   [phone]

## 2021-04-14 ENCOUNTER — Other Ambulatory Visit: Payer: Self-pay

## 2021-04-14 DIAGNOSIS — G8929 Other chronic pain: Secondary | ICD-10-CM

## 2021-04-14 DIAGNOSIS — M25561 Pain in right knee: Secondary | ICD-10-CM

## 2021-04-14 NOTE — Telephone Encounter (Signed)
LMOM for patient letting her know I sent an order for MRI and to call back and schedule a FU appt with DUDA after MRI

## 2021-04-15 ENCOUNTER — Ambulatory Visit: Payer: BC Managed Care – PPO | Admitting: Family

## 2021-04-25 ENCOUNTER — Other Ambulatory Visit: Payer: Self-pay

## 2021-04-25 ENCOUNTER — Ambulatory Visit
Admission: RE | Admit: 2021-04-25 | Discharge: 2021-04-25 | Disposition: A | Discharge status: Home / Self Care | Payer: BC Managed Care – PPO | Source: Ambulatory Visit | Attending: Orthopedic Surgery | Admitting: Orthopedic Surgery

## 2021-04-25 DIAGNOSIS — M1711 Unilateral primary osteoarthritis, right knee: Secondary | ICD-10-CM | POA: Diagnosis not present

## 2021-04-25 DIAGNOSIS — M7121 Synovial cyst of popliteal space [Baker], right knee: Secondary | ICD-10-CM | POA: Diagnosis not present

## 2021-04-25 DIAGNOSIS — S83231A Complex tear of medial meniscus, current injury, right knee, initial encounter: Secondary | ICD-10-CM | POA: Diagnosis not present

## 2021-04-25 DIAGNOSIS — G8929 Other chronic pain: Secondary | ICD-10-CM

## 2021-04-25 DIAGNOSIS — R531 Weakness: Secondary | ICD-10-CM | POA: Diagnosis not present

## 2021-04-28 ENCOUNTER — Ambulatory Visit (INDEPENDENT_AMBULATORY_CARE_PROVIDER_SITE_OTHER): Payer: BC Managed Care – PPO | Admitting: Orthopedic Surgery

## 2021-04-28 ENCOUNTER — Encounter (INDEPENDENT_AMBULATORY_CARE_PROVIDER_SITE_OTHER): Payer: Self-pay

## 2021-04-28 DIAGNOSIS — G8929 Other chronic pain: Secondary | ICD-10-CM

## 2021-04-28 DIAGNOSIS — M23321 Other meniscus derangements, posterior horn of medial meniscus, right knee: Secondary | ICD-10-CM | POA: Diagnosis not present

## 2021-04-28 DIAGNOSIS — M25561 Pain in right knee: Secondary | ICD-10-CM

## 2021-05-04 ENCOUNTER — Encounter: Payer: Self-pay | Admitting: Orthopedic Surgery

## 2021-05-04 NOTE — Progress Notes (Signed)
   Office Visit Note   Patient: Cheryl Baker           Date of Birth: [birth year]           MRN: [id number] Visit Date: 04/28/2021              Requested by: Lupita Raider, MD [address] [place],  [place] [address] PCP: Lupita Raider, MD  Chief Complaint  Patient presents with   Right Knee - Follow-up      HPI:  Patient is a 65 year old woman who presents follow-up right knee status post MRI scan.  Patient complains of persistent pain and mechanical symptoms with activities of daily living.  Assessment: 1. Chronic pain of right knee   2. Other meniscus derangements, posterior horn of medial meniscus, right knee     Plan: Discussed treatment options including arthroscopic debridement.  Discussed risk of persistent pain from the osteoarthritis.  Risks and benefits were discussed patient states he understands wished to proceed with right knee arthroscopy for debridement.  Follow-Up Instructions: Return if symptoms worsen or fail to improve.   Ortho Exam  Patient is alert, oriented, no adenopathy, well-dressed, normal affect, normal respiratory effort. Examination the right knee there is no effusion she is tender to palpation of medial joint there are mechanical symptoms with flexion and internal rotation of the right knee.  Review of the MRI scan shows a complex tear involving the posterior horn of the medial meniscus.  The ligaments are intact there is tricompartmental degenerative changes worse in the medial compartment patient also has a moderate joint effusion.  Imaging: No results found. No images are attached to the encounter.  Labs: No results found for: HGBA1C, ESRSEDRATE, CRP, LABURIC, REPTSTATUS, GRAMSTAIN, CULT, LABORGA   Lab Results  Component Value Date   ALBUMIN 4.0 04/29/2008    No results found for: MG No results found for: VD25OH  No results found for: PREALBUMIN CBC EXTENDED 04/29/2008  WBC 5.4  RBC 5.10  HGB 15.3(H)  HCT  44.6  PLT 220  NEUTROABS 2.8  LYMPHSABS 1.9     There is no height or weight on file to calculate BMI.  Orders:  No orders of the defined types were placed in this encounter.  No orders of the defined types were placed in this encounter.    Procedures: No procedures performed  Clinical Data: No additional findings.  ROS:  All other systems negative, except as noted in the HPI. Review of Systems  Objective: Vital Signs: There were no vitals taken for this visit.  Specialty Comments:  No specialty comments available.  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 No pertinent family history.  Past Surgical History:  Procedure Laterality Date   ABDOMINAL HYSTERECTOMY     ANKLE SURGERY     Social History   Occupational History   Not on file  Tobacco Use   Smoking status: Never   Smokeless tobacco: Never  Substance and Sexual Activity   Alcohol use: No   Drug use: No   Sexual activity: Not on file

## 2021-06-17 DIAGNOSIS — Z23 Encounter for immunization: Secondary | ICD-10-CM | POA: Diagnosis not present

## 2021-06-18 ENCOUNTER — Other Ambulatory Visit: Payer: Self-pay

## 2021-06-22 ENCOUNTER — Telehealth: Payer: Self-pay | Admitting: Orthopedic Surgery

## 2021-06-22 NOTE — Telephone Encounter (Signed)
Pt asking for a call back from [facility] when possible around 1:30pm-2pm. Pt was told by Elnita Maxwell to send paperwork for surg with attention to [facility] and pt wanted to make sure it has come through. The best call back number is [phone].

## 2021-06-23 ENCOUNTER — Other Ambulatory Visit: Payer: Self-pay

## 2021-06-23 ENCOUNTER — Encounter (HOSPITAL_BASED_OUTPATIENT_CLINIC_OR_DEPARTMENT_OTHER): Payer: Self-pay | Admitting: Orthopedic Surgery

## 2021-06-23 ENCOUNTER — Other Ambulatory Visit: Payer: Self-pay | Admitting: Family

## 2021-06-24 ENCOUNTER — Encounter (HOSPITAL_BASED_OUTPATIENT_CLINIC_OR_DEPARTMENT_OTHER)
Admission: RE | Admit: 2021-06-24 | Discharge: 2021-06-24 | Disposition: A | Discharge status: Home / Self Care | Payer: BC Managed Care – PPO | Source: Ambulatory Visit | Attending: Orthopedic Surgery | Admitting: Orthopedic Surgery

## 2021-06-24 ENCOUNTER — Other Ambulatory Visit: Payer: Self-pay

## 2021-06-24 DIAGNOSIS — Z01810 Encounter for preprocedural cardiovascular examination: Secondary | ICD-10-CM | POA: Insufficient documentation

## 2021-06-24 DIAGNOSIS — G8929 Other chronic pain: Secondary | ICD-10-CM | POA: Diagnosis not present

## 2021-06-24 DIAGNOSIS — I10 Essential (primary) hypertension: Secondary | ICD-10-CM | POA: Diagnosis not present

## 2021-06-24 DIAGNOSIS — S83231A Complex tear of medial meniscus, current injury, right knee, initial encounter: Secondary | ICD-10-CM | POA: Diagnosis not present

## 2021-06-24 DIAGNOSIS — M25561 Pain in right knee: Secondary | ICD-10-CM | POA: Diagnosis not present

## 2021-06-24 NOTE — Telephone Encounter (Signed)
Did we receive any paperwork from this pt?

## 2021-06-24 NOTE — Telephone Encounter (Signed)
I called pt to advise that I did not rec paperwork.  She will call [employer] to resend.

## 2021-06-24 NOTE — Telephone Encounter (Signed)
No paperwork received.

## 2021-06-24 NOTE — Progress Notes (Signed)

## 2021-06-29 ENCOUNTER — Telehealth: Payer: Self-pay | Admitting: Orthopedic Surgery

## 2021-06-29 NOTE — Telephone Encounter (Signed)
Pt called and is wanting to make sure you have received all the paperwork for her to go into surgery tomorrow?   CB [phone]

## 2021-06-29 NOTE — Telephone Encounter (Signed)
Can you please call pt and explain what you sent? There have not been any forms for completion.

## 2021-06-29 NOTE — Anesthesia Preprocedure Evaluation (Addendum)
Anesthesia Evaluation  Patient identified by MRN, date of birth, ID band Patient awake    Reviewed: Allergy & Precautions, NPO status , Patient's Chart, lab work & pertinent test results  Airway Mallampati: II  TM Distance: >3 FB Neck ROM: Full    Dental no notable dental hx. (+) Implants, Upper Dentures, Dental Advisory Given   Pulmonary neg pulmonary ROS,    Pulmonary exam normal breath sounds clear to auscultation       Cardiovascular hypertension, Pt. on medications Normal cardiovascular exam Rhythm:Regular Rate:Normal  06/24/21 EKG SR R73 W 1st deg Av Block and PVCs   Neuro/Psych negative neurological ROS     GI/Hepatic Neg liver ROS, GERD  ,  Endo/Other  negative endocrine ROS  Renal/GU negative Renal ROS     Musculoskeletal   Abdominal (+) + obese (BMI 30.54),   Peds  Hematology   Anesthesia Other Findings All: ASA, PCN, Prednisone  Reproductive/Obstetrics                            Anesthesia Physical Anesthesia Plan  ASA: 2  Anesthesia Plan: General   Post-op Pain Management:    Induction: Intravenous  PONV Risk Score and Plan: 4 or greater and Treatment may vary due to age or medical condition, Midazolam, Ondansetron and Dexamethasone  Airway Management Planned: LMA  Additional Equipment: None  Intra-op Plan:   Post-operative Plan:   Informed Consent: I have reviewed the patients History and Physical, chart, labs and discussed the procedure including the risks, benefits and alternatives for the proposed anesthesia with the patient or authorized representative who has indicated his/her understanding and acceptance.     Dental advisory given  Plan Discussed with: CRNA  Anesthesia Plan Comments: (LMA GA)       Anesthesia Quick Evaluation

## 2021-06-29 NOTE — Telephone Encounter (Signed)
Nothing has been sent. [employer] has requested DOS 06/30/21. That will be the OP Note from the 06/30/21 surgery we send at that time.

## 2021-06-29 NOTE — Telephone Encounter (Signed)
Front desk advised pt that we have no forms.

## 2021-06-30 ENCOUNTER — Encounter (HOSPITAL_BASED_OUTPATIENT_CLINIC_OR_DEPARTMENT_OTHER)
Admission: RE | Disposition: A | Discharge status: Home / Self Care | Payer: Self-pay | Source: Home / Self Care | Attending: Orthopedic Surgery

## 2021-06-30 ENCOUNTER — Ambulatory Visit (HOSPITAL_BASED_OUTPATIENT_CLINIC_OR_DEPARTMENT_OTHER): Payer: BC Managed Care – PPO | Admitting: Anesthesiology

## 2021-06-30 ENCOUNTER — Ambulatory Visit (HOSPITAL_BASED_OUTPATIENT_CLINIC_OR_DEPARTMENT_OTHER)
Admission: RE | Admit: 2021-06-30 | Discharge: 2021-06-30 | Disposition: A | Discharge status: Home / Self Care | Payer: BC Managed Care – PPO | Attending: Orthopedic Surgery | Admitting: Orthopedic Surgery

## 2021-06-30 ENCOUNTER — Other Ambulatory Visit: Payer: Self-pay

## 2021-06-30 ENCOUNTER — Encounter (HOSPITAL_BASED_OUTPATIENT_CLINIC_OR_DEPARTMENT_OTHER): Payer: Self-pay | Admitting: Orthopedic Surgery

## 2021-06-30 DIAGNOSIS — M23203 Derangement of unspecified medial meniscus due to old tear or injury, right knee: Secondary | ICD-10-CM

## 2021-06-30 DIAGNOSIS — Z79899 Other long term (current) drug therapy: Secondary | ICD-10-CM | POA: Diagnosis not present

## 2021-06-30 DIAGNOSIS — E785 Hyperlipidemia, unspecified: Secondary | ICD-10-CM | POA: Diagnosis not present

## 2021-06-30 DIAGNOSIS — S83231A Complex tear of medial meniscus, current injury, right knee, initial encounter: Secondary | ICD-10-CM | POA: Diagnosis not present

## 2021-06-30 DIAGNOSIS — X58XXXA Exposure to other specified factors, initial encounter: Secondary | ICD-10-CM | POA: Insufficient documentation

## 2021-06-30 DIAGNOSIS — M21861 Other specified acquired deformities of right lower leg: Secondary | ICD-10-CM | POA: Diagnosis not present

## 2021-06-30 DIAGNOSIS — G8929 Other chronic pain: Secondary | ICD-10-CM | POA: Diagnosis not present

## 2021-06-30 DIAGNOSIS — M6751 Plica syndrome, right knee: Secondary | ICD-10-CM | POA: Diagnosis not present

## 2021-06-30 DIAGNOSIS — I10 Essential (primary) hypertension: Secondary | ICD-10-CM | POA: Diagnosis not present

## 2021-06-30 DIAGNOSIS — M25561 Pain in right knee: Secondary | ICD-10-CM | POA: Insufficient documentation

## 2021-06-30 DIAGNOSIS — S83241A Other tear of medial meniscus, current injury, right knee, initial encounter: Secondary | ICD-10-CM | POA: Diagnosis not present

## 2021-06-30 HISTORY — DX: Other tear of medial meniscus, current injury, unspecified knee, initial encounter: S83.249A

## 2021-06-30 HISTORY — DX: Gastro-esophageal reflux disease without esophagitis: K21.9

## 2021-06-30 HISTORY — PX: KNEE ARTHROSCOPY WITH MEDIAL MENISECTOMY: SHX5651

## 2021-06-30 HISTORY — DX: Prediabetes: R73.03

## 2021-06-30 SURGERY — ARTHROSCOPY, KNEE, WITH MEDIAL MENISCECTOMY
Anesthesia: General | Site: Knee | Laterality: Right

## 2021-06-30 MED ORDER — PROPOFOL 10 MG/ML IV BOLUS
INTRAVENOUS | Status: DC | PRN
  Administered 2021-06-30: 100 mg via INTRAVENOUS

## 2021-06-30 MED ORDER — CLINDAMYCIN PHOSPHATE 900 MG/50ML IV SOLN
900.0000 mg | INTRAVENOUS | Status: AC
  Administered 2021-06-30: 900 mg via INTRAVENOUS

## 2021-06-30 MED ORDER — ONDANSETRON HCL 4 MG/2ML IJ SOLN
INTRAMUSCULAR | Status: AC
  Filled 2021-06-30: qty 2

## 2021-06-30 MED ORDER — CLINDAMYCIN PHOSPHATE 900 MG/50ML IV SOLN
INTRAVENOUS | Status: AC
  Filled 2021-06-30: qty 50

## 2021-06-30 MED ORDER — SODIUM CHLORIDE 0.9 % IR SOLN
Status: DC | PRN
  Administered 2021-06-30: 6000 mL

## 2021-06-30 MED ORDER — HYDROMORPHONE HCL 1 MG/ML IJ SOLN: 0.2500 mg | INTRAMUSCULAR | Status: DC | PRN

## 2021-06-30 MED ORDER — AMISULPRIDE (ANTIEMETIC) 5 MG/2ML IV SOLN: 10.0000 mg | Freq: Once | INTRAVENOUS | Status: DC | PRN

## 2021-06-30 MED ORDER — MIDAZOLAM HCL 5 MG/5ML IJ SOLN
INTRAMUSCULAR | Status: DC | PRN
  Administered 2021-06-30 (×2): 1 mg via INTRAVENOUS

## 2021-06-30 MED ORDER — BUPIVACAINE HCL (PF) 0.5 % IJ SOLN
INTRAMUSCULAR | Status: DC | PRN
  Administered 2021-06-30: 20 mL

## 2021-06-30 MED ORDER — MIDAZOLAM HCL 2 MG/2ML IJ SOLN
INTRAMUSCULAR | Status: AC
  Filled 2021-06-30: qty 2

## 2021-06-30 MED ORDER — OXYCODONE HCL 5 MG/5ML PO SOLN: 5.0000 mg | Freq: Once | ORAL | Status: AC | PRN

## 2021-06-30 MED ORDER — OXYCODONE HCL 5 MG PO TABS
ORAL_TABLET | ORAL | Status: AC
  Filled 2021-06-30: qty 1

## 2021-06-30 MED ORDER — LACTATED RINGERS IV SOLN: INTRAVENOUS | Status: DC

## 2021-06-30 MED ORDER — ONDANSETRON HCL 4 MG/2ML IJ SOLN: 4.0000 mg | Freq: Once | INTRAMUSCULAR | Status: DC | PRN

## 2021-06-30 MED ORDER — DEXAMETHASONE SODIUM PHOSPHATE 4 MG/ML IJ SOLN
INTRAMUSCULAR | Status: DC | PRN
  Administered 2021-06-30: 4 mg via INTRAVENOUS

## 2021-06-30 MED ORDER — LIDOCAINE 2% (20 MG/ML) 5 ML SYRINGE
INTRAMUSCULAR | Status: AC
  Filled 2021-06-30: qty 5

## 2021-06-30 MED ORDER — OXYCODONE HCL 5 MG PO TABS
5.0000 mg | ORAL_TABLET | Freq: Once | ORAL | Status: AC | PRN
  Administered 2021-06-30: 5 mg via ORAL

## 2021-06-30 MED ORDER — FENTANYL CITRATE (PF) 100 MCG/2ML IJ SOLN
INTRAMUSCULAR | Status: DC | PRN
  Administered 2021-06-30 (×3): 25 ug via INTRAVENOUS

## 2021-06-30 MED ORDER — DEXMEDETOMIDINE (PRECEDEX) IN NS 20 MCG/5ML (4 MCG/ML) IV SYRINGE
PREFILLED_SYRINGE | INTRAVENOUS | Status: DC | PRN
  Administered 2021-06-30: 4 ug via INTRAVENOUS

## 2021-06-30 MED ORDER — DEXAMETHASONE SODIUM PHOSPHATE 10 MG/ML IJ SOLN
INTRAMUSCULAR | Status: AC
  Filled 2021-06-30: qty 1

## 2021-06-30 MED ORDER — OXYCODONE-ACETAMINOPHEN 5-325 MG PO TABS: 1.0000 | ORAL_TABLET | ORAL | 0 refills | Status: DC | PRN

## 2021-06-30 MED ORDER — LIDOCAINE 2% (20 MG/ML) 5 ML SYRINGE
INTRAMUSCULAR | Status: DC | PRN
  Administered 2021-06-30: 100 mg via INTRAVENOUS

## 2021-06-30 MED ORDER — FENTANYL CITRATE (PF) 100 MCG/2ML IJ SOLN
INTRAMUSCULAR | Status: AC
  Filled 2021-06-30: qty 2

## 2021-06-30 MED ORDER — ACETAMINOPHEN 10 MG/ML IV SOLN: 1000.0000 mg | Freq: Once | INTRAVENOUS | Status: DC | PRN

## 2021-06-30 SURGICAL SUPPLY — 27 items
BLADE EXCALIBUR 4.0MM X 13CM (MISCELLANEOUS) ×1
BLADE EXCALIBUR 4.0X13 (MISCELLANEOUS) ×2 IMPLANT
BNDG COHESIVE 6X5 TAN ST LF (GAUZE/BANDAGES/DRESSINGS) ×3 IMPLANT
DISSECTOR 4.0MM X 13CM (MISCELLANEOUS) IMPLANT
DRAPE ARTHROSCOPY W/POUCH 90 (DRAPES) ×3 IMPLANT
DRAPE U-SHAPE 47X51 STRL (DRAPES) ×3 IMPLANT
DRSG EMULSION OIL 3X3 NADH (GAUZE/BANDAGES/DRESSINGS) ×3 IMPLANT
DURAPREP 26ML APPLICATOR (WOUND CARE) ×3 IMPLANT
EXCALIBUR 3.8MM X 13CM (MISCELLANEOUS) IMPLANT
GAUZE SPONGE 4X4 12PLY STRL (GAUZE/BANDAGES/DRESSINGS) ×3 IMPLANT
GLOVE SURG ORTHO LTX SZ9 (GLOVE) ×3 IMPLANT
GLOVE SURG UNDER POLY LF SZ9 (GLOVE) ×3 IMPLANT
GOWN STRL REUS W/ TWL LRG LVL3 (GOWN DISPOSABLE) ×1 IMPLANT
GOWN STRL REUS W/ TWL XL LVL3 (GOWN DISPOSABLE) ×1 IMPLANT
GOWN STRL REUS W/TWL LRG LVL3 (GOWN DISPOSABLE) ×3
GOWN STRL REUS W/TWL XL LVL3 (GOWN DISPOSABLE) ×3
MANIFOLD NEPTUNE II (INSTRUMENTS) ×2 IMPLANT
NDL SAFETY ECLIPSE 18X1.5 (NEEDLE) ×1 IMPLANT
NEEDLE HYPO 18GX1.5 SHARP (NEEDLE) ×3
PACK ARTHROSCOPY DSU (CUSTOM PROCEDURE TRAY) ×3 IMPLANT
PACK BASIN DAY SURGERY FS (CUSTOM PROCEDURE TRAY) ×3 IMPLANT
PORT APPOLLO RF 90DEGREE MULTI (SURGICAL WAND) IMPLANT
PROBE APOLLO 90XL (SURGICAL WAND) ×2 IMPLANT
SUT ETHILON 2 0 FSLX (SUTURE) ×3 IMPLANT
TOWEL GREEN STERILE FF (TOWEL DISPOSABLE) ×3 IMPLANT
TUBING ARTHROSCOPY IRRIG 16FT (MISCELLANEOUS) ×3 IMPLANT
WRAP KNEE MAXI GEL POST OP (GAUZE/BANDAGES/DRESSINGS) ×2 IMPLANT

## 2021-06-30 NOTE — Op Note (Signed)
06/30/2021  11:04 AM  PATIENT:  Cheryl Baker    PRE-OPERATIVE DIAGNOSIS:  Medial Meniscal Tear Right Knee  POST-OPERATIVE DIAGNOSIS:  Same  PROCEDURE:  RIGHT KNEE ARTHROSCOPY AND DEBRIDEMENT of medial meniscus tear  SURGEON:  Nadara Mustard, MD  PHYSICIAN ASSISTANT:None ANESTHESIA:   General  PREOPERATIVE INDICATIONS:  [name] is a  65 y.o. female with a diagnosis of Medial Meniscal Tear Right Knee who failed conservative measures and elected for surgical management.    The risks benefits and alternatives were discussed with the patient preoperatively including but not limited to the risks of infection, bleeding, nerve injury, cardiopulmonary complications, the need for revision surgery, among others, and the patient was willing to proceed.  OPERATIVE IMPLANTS: None  @ENCIMAGES @  OPERATIVE FINDINGS: Large complex tear of the medial meniscus with large osteochondral defect of the medial femoral condyle and medial tibial plateau  OPERATIVE PROCEDURE: Patient brought the operating room and underwent a general anesthetic.  After adequate levels anesthesia were obtained patient's right lower extremity was prepped using DuraPrep draped into a sterile field a timeout was called.  The scope was inserted through the anterior lateral portal and anterior medial working portal was established.  Visualization showed a large osteochondral defect of the medial femoral condyle and medial tibial plateau.  Using the shaver this was debrided back to stable viable subchondral bleeding bone.  There is a large complex tear of the posterior horn of the medial meniscus and this was debrided with a shaver.  The electrical wand was used for hemostasis on the soft tissue.  Examination the notch showed an intact ACL examination the lateral joint line in the figure-of-four position showed intact joint line and intact lateral meniscus.  Examination of the patellofemoral joint showed osteochondral  changes of the patella and trochlea with a large plica this was also debrided.  A survey of all compartments showed there to be no loose bodies.  The instruments were removed the joint was infused with 20 cc of quarter percent Marcaine plain and a sterile dressing was applied patient was extubated taken the PACU in stable condition   DISCHARGE PLANNING:  Antibiotic duration: Preoperative antibiotics  Weightbearing: Weightbearing as tolerated  Pain medication: Prescription for Percocet  Dressing care/ Wound VAC: Dry dressing remove in 2 days  Ambulatory devices: Crutches  Discharge to: Home.  Follow-up: In the office 1 week post operative.

## 2021-06-30 NOTE — Anesthesia Procedure Notes (Signed)
Procedure Name: LMA Insertion Date/Time: 06/30/2021 10:24 AM Performed by: Burna Cash, CRNA Pre-anesthesia Checklist: Patient identified, Emergency Drugs available, Suction available and Patient being monitored Patient Re-evaluated:Patient Re-evaluated prior to induction Oxygen Delivery Method: Circle system utilized Preoxygenation: Pre-oxygenation with 100% oxygen Induction Type: IV induction Ventilation: Mask ventilation without difficulty LMA: LMA inserted LMA Size: 4.0 Number of attempts: 1 Airway Equipment and Method: Bite block Placement Confirmation: positive ETCO2 Tube secured with: Tape Dental Injury: Teeth and Oropharynx as per pre-operative assessment

## 2021-06-30 NOTE — Transfer of Care (Signed)
Immediate Anesthesia Transfer of Care Note  Patient: Cheryl Baker  Procedure(s) Performed: RIGHT KNEE ARTHROSCOPY AND DEBRIDEMENT of medial meniscus tear (Right: Knee)  Patient Location: PACU  Anesthesia Type:General  Level of Consciousness: sedated  Airway & Oxygen Therapy: Patient Spontanous Breathing and Patient connected to face mask oxygen  Post-op Assessment: Report given to RN and Post -op Vital signs reviewed and stable  Post vital signs: Reviewed and stable  Last Vitals:  Vitals Value Taken Time  BP 93/71 06/30/21 1100  Temp    Pulse 67 06/30/21 1102  Resp 7 06/30/21 1102  SpO2 97 % 06/30/21 1102  Vitals shown include unvalidated device data.  Last Pain:  Vitals:   06/30/21 0843  TempSrc: Oral  PainSc: 4          Complications: No notable events documented.

## 2021-06-30 NOTE — Anesthesia Postprocedure Evaluation (Signed)
Anesthesia Post Note  Patient: Cheryl Baker  Procedure(s) Performed: RIGHT KNEE ARTHROSCOPY AND DEBRIDEMENT of medial meniscus tear (Right: Knee)     Patient location during evaluation: PACU Anesthesia Type: General Level of consciousness: awake and alert Pain management: pain level controlled Vital Signs Assessment: post-procedure vital signs reviewed and stable Respiratory status: spontaneous breathing, nonlabored ventilation, respiratory function stable and patient connected to nasal cannula oxygen Cardiovascular status: blood pressure returned to baseline and stable Postop Assessment: no apparent nausea or vomiting Anesthetic complications: no   No notable events documented.  Last Vitals:  Vitals:   06/30/21 1145 06/30/21 1227  BP: 139/90 (!) 129/96  Pulse: 63 61  Resp: 17 16  Temp:  36.5 C  SpO2: 99% 99%    Last Pain:  Vitals:   06/30/21 1227  TempSrc:   PainSc: 4                  [name]

## 2021-06-30 NOTE — H&P (Signed)
Cheryl Baker is an 65 y.o. female.   Chief Complaint: Right knee pain with mechanical symptoms. HPI: Patient is a 65 year old woman who has had an MRI scan of her knee she has failed conservative treatment complains of mechanical pain catching locking and giving way with activities of daily living right knee with swelling.  Past Medical History:  Diagnosis Date   GERD (gastroesophageal reflux disease)    Hyperlipidemia    Hypertension    MMT (medial meniscus tear)    right   Pre-diabetes     Past Surgical History:  Procedure Laterality Date   ABDOMINAL HYSTERECTOMY     ACHILLES TENDON REPAIR Left    ANKLE SURGERY      History reviewed. No pertinent family history. Social History:  reports that she has never smoked. She has never used smokeless tobacco. She reports that she does not drink alcohol and does not use drugs.  Allergies:  Allergies  Allergen Reactions   Aspirin     High doses of aspirin irritate her stomach ulcer   Penicillins    Prednisone Nausea Only    Medications Prior to Admission  Medication Sig Dispense Refill   amLODipine (NORVASC) 5 MG tablet Take 5 mg by mouth daily.     Ascorbic Acid (VITAMIN C PO) Take 1 tablet by mouth daily.     aspirin EC 81 MG tablet Take 81 mg by mouth daily.     atorvastatin (LIPITOR) 40 MG tablet Take 40 mg by mouth daily.     Calcium Carbonate-Vitamin D 600-200 MG-UNIT TABS Take by mouth daily.     finasteride (PROSCAR) 5 MG tablet Take by mouth.     ibuprofen (ADVIL) 200 MG tablet Take 200 mg by mouth every 8 (eight) hours as needed.     Multiple Vitamin (MULTI-VITAMIN) tablet Take by mouth.     Multiple Vitamins-Minerals (HAIR SKIN AND NAILS FORMULA PO) Take by mouth.     potassium chloride SA (KLOR-CON) 20 MEQ tablet Take 20 mEq by mouth as needed.     Colchicine (MITIGARE) 0.6 MG CAPS Take 1 capsule by mouth 2 (two) times daily as needed. 60 capsule 3    No results found for this or any previous visit (from the  past 48 hour(s)). No results found.  Review of Systems  All other systems reviewed and are negative.  Height 5\' 2"  (1.575 m), weight 75.8 kg. Physical Exam  Patient is alert, oriented, no adenopathy, well-dressed, normal affect, normal respiratory effort. Examination the right knee there is no effusion she is tender to palpation of medial joint there are mechanical symptoms with flexion and internal rotation of the right knee.  Review of the MRI scan shows a complex tear involving the posterior horn of the medial meniscus.  The ligaments are intact there is tricompartmental degenerative changes worse in the medial compartment patient also has a moderate joint effusion. Assessment/Plan 1. Chronic pain of right knee   2. Other meniscus derangements, posterior horn of medial meniscus, right knee       Plan: Discussed treatment options including arthroscopic debridement.  Discussed risk of persistent pain from the osteoarthritis.  Risks and benefits were discussed patient states he understands wished to proceed with right knee arthroscopy for debridement.  , MD 06/30/2021, 8:38 AM

## 2021-06-30 NOTE — Discharge Instructions (Signed)

## 2021-07-01 ENCOUNTER — Encounter (HOSPITAL_BASED_OUTPATIENT_CLINIC_OR_DEPARTMENT_OTHER): Payer: Self-pay | Admitting: Orthopedic Surgery

## 2021-07-07 ENCOUNTER — Ambulatory Visit (INDEPENDENT_AMBULATORY_CARE_PROVIDER_SITE_OTHER): Payer: BC Managed Care – PPO | Admitting: Orthopedic Surgery

## 2021-07-07 ENCOUNTER — Other Ambulatory Visit: Payer: Self-pay

## 2021-07-07 ENCOUNTER — Telehealth: Payer: Self-pay | Admitting: Orthopedic Surgery

## 2021-07-07 DIAGNOSIS — M23321 Other meniscus derangements, posterior horn of medial meniscus, right knee: Secondary | ICD-10-CM

## 2021-07-07 NOTE — Telephone Encounter (Signed)
Can you please call this pt and make an appt for this morning with Denny Peon let me know what time and I will open it. Thanks!

## 2021-07-07 NOTE — Telephone Encounter (Signed)
Pt called and states her knee is swollen and is in a lot of pain. Knee is not hot to touch. She would like someone to give her a call.   CB [phone]

## 2021-07-07 NOTE — Telephone Encounter (Signed)
Done

## 2021-07-07 NOTE — Telephone Encounter (Signed)
Can you open me a spot for 2pm?

## 2021-07-13 ENCOUNTER — Encounter: Payer: BC Managed Care – PPO | Admitting: Orthopedic Surgery

## 2021-07-14 ENCOUNTER — Encounter: Payer: Self-pay | Admitting: Orthopedic Surgery

## 2021-07-14 NOTE — Progress Notes (Signed)
Office Visit Note   Patient: Cheryl Baker           Date of Birth: Mar 31, 1956           MRN: [id number] Visit Date: 07/07/2021              Requested by: Lupita Raider, MD [address] [place],  [place] [address] PCP: Lupita Raider, MD  Chief Complaint  Patient presents with   Right Knee - Routine Post Op    Scope and deb of medial meniscus tear 06/30/21      HPI:  Patient is 1 week status post right knee arthroscopy with debridement medial meniscal tear and debridement of osteochondral defects of the medial joint line.  Patient states she is not walking much has been elevating her leg at home. Assessment & Plan: Visit Diagnoses:  1. Other meniscus derangements, posterior horn of medial meniscus, right knee     Plan: Recommended using ice 20 minutes at a time start with isometric straight leg raises.  Sutures are harvested.  Follow-Up Instructions: Return in about 2 weeks (around 07/21/2021).   Ortho Exam  Patient is alert, oriented, no adenopathy, well-dressed, normal affect, normal respiratory effort. Examination patient has a moderate effusion there is no cellulitis no drainage no signs of infection.  Portals are well-healed sutures harvested.  Imaging: No results found. No images are attached to the encounter.  Labs: No results found for: HGBA1C, ESRSEDRATE, CRP, LABURIC, REPTSTATUS, GRAMSTAIN, CULT, LABORGA   Lab Results  Component Value Date   ALBUMIN 4.0 04/29/2008    No results found for: MG No results found for: VD25OH  No results found for: PREALBUMIN CBC EXTENDED 04/29/2008  WBC 5.4  RBC 5.10  HGB 15.3(H)  HCT 44.6  PLT 220  NEUTROABS 2.8  LYMPHSABS 1.9     There is no height or weight on file to calculate BMI.  Orders:  No orders of the defined types were placed in this encounter.  No orders of the defined types were placed in this encounter.    Procedures: No procedures performed  Clinical Data: No  additional findings.  ROS:  All other systems negative, except as noted in the HPI. Review of Systems  Objective: Vital Signs: There were no vitals taken for this visit.  Specialty Comments:  No specialty comments available.  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 No pertinent family history.  Past Surgical History:  Procedure Laterality Date   ABDOMINAL HYSTERECTOMY     ACHILLES TENDON REPAIR Left    ANKLE SURGERY     KNEE ARTHROSCOPY WITH MEDIAL MENISECTOMY Right 06/30/2021   Procedure: RIGHT KNEE ARTHROSCOPY AND DEBRIDEMENT of medial meniscus tear;  Surgeon: Nadara Mustard, MD;  Location: [facility];  Service: Orthopedics;  Laterality: Right;   Social History   Occupational History   Not on file  Tobacco Use   Smoking status: Never   Smokeless tobacco: Never  Substance and Sexual Activity   Alcohol use: No   Drug use: No   Sexual activity: Not on file

## 2021-07-16 DIAGNOSIS — Z20822 Contact with and (suspected) exposure to covid-19: Secondary | ICD-10-CM | POA: Diagnosis not present

## 2021-07-20 ENCOUNTER — Telehealth: Payer: Self-pay | Admitting: Orthopedic Surgery

## 2021-07-20 DIAGNOSIS — U071 COVID-19: Secondary | ICD-10-CM | POA: Diagnosis not present

## 2021-07-20 NOTE — Telephone Encounter (Signed)
Pt called stating she tested positive for covid, so she had to move her 07/21/21 appt to 08/04/21. Pt states she is still  out of work so she will need a note for her job extending her leave until after her appt. Pt would like a CB when the note is ready so she can give [place] the fax number it needs to be sent to.   [phone]

## 2021-07-21 ENCOUNTER — Telehealth: Payer: Self-pay | Admitting: Orthopedic Surgery

## 2021-07-21 ENCOUNTER — Encounter: Payer: BC Managed Care – PPO | Admitting: Orthopedic Surgery

## 2021-07-21 NOTE — Telephone Encounter (Signed)
Sw pt, she will be written out of work until her next follow up appt with Dr. Lajoyce Corners. Then we will see if she can be released to work then. Letter will be written and faxed to [employer] per her request.

## 2021-07-21 NOTE — Telephone Encounter (Signed)
Letter written and faxed to [phone] per pt request.

## 2021-07-21 NOTE — Telephone Encounter (Signed)
lmtcb

## 2021-07-21 NOTE — Telephone Encounter (Signed)
Called no answer

## 2021-07-21 NOTE — Telephone Encounter (Signed)
Sw pt, see previous message about extending work note.

## 2021-07-21 NOTE — Telephone Encounter (Signed)
Can you please call and find out when she wants to return to work and fax number for note? We can generate an updated note she is s/p a knee scope 06/30/21

## 2021-07-21 NOTE — Telephone Encounter (Signed)
Pt returned call to [place]. Please call pt at [phone].

## 2021-07-22 NOTE — Telephone Encounter (Signed)
Patient following up on note that was supposed to be sent to her job [employer] regarding how long she was to be written out of work for her knee surgery.

## 2021-07-22 NOTE — Telephone Encounter (Signed)
Lmtcb. I wrote letter to [employer] for work extension until 08/04/21 that is when she follows up with Dr. Lajoyce Corners, then we can at that time determine if she needs more time out of work or any work restrictions. This was discussed with her yesterday on the phone.

## 2021-07-24 NOTE — Telephone Encounter (Signed)
Pt informed and agrees with the letter. I confirmed the letter was faxed to [facility] the day of our last conversation and got confirmation that was received.

## 2021-07-27 ENCOUNTER — Telehealth: Payer: Self-pay | Admitting: Orthopedic Surgery

## 2021-07-27 NOTE — Telephone Encounter (Signed)
Pt called stating she's been discussing her extended care through [employer] with [place] and she would like a CB to discuss it further.   [phone]

## 2021-07-27 NOTE — Telephone Encounter (Signed)
I SW pt, she says that [employer] wants more medical details as to why she will not be going into work until after her appt with [place] on 08/04/21. She would like [place] to re-write a letter to them and send it in today for her.

## 2021-08-04 ENCOUNTER — Ambulatory Visit (INDEPENDENT_AMBULATORY_CARE_PROVIDER_SITE_OTHER): Payer: BC Managed Care – PPO | Admitting: Orthopedic Surgery

## 2021-08-04 ENCOUNTER — Other Ambulatory Visit: Payer: Self-pay

## 2021-08-04 ENCOUNTER — Telehealth: Payer: Self-pay | Admitting: Orthopedic Surgery

## 2021-08-04 ENCOUNTER — Encounter: Payer: Self-pay | Admitting: Orthopedic Surgery

## 2021-08-04 DIAGNOSIS — M23321 Other meniscus derangements, posterior horn of medial meniscus, right knee: Secondary | ICD-10-CM

## 2021-08-04 MED ORDER — OXYCODONE-ACETAMINOPHEN 5-325 MG PO TABS: 1.0000 | ORAL_TABLET | ORAL | 0 refills | Status: AC | PRN

## 2021-08-04 NOTE — Telephone Encounter (Signed)
Today's office visit faxed to [phone] claim number [id number]

## 2021-08-04 NOTE — Telephone Encounter (Signed)
Pt asked for Autumn F to send today's office notes to [employer]. Explained to pt to please notify Autumn of [place] that notes need to be sent to [facility] when she has an office visit. She understood. Pt phone number is [phone].

## 2021-08-04 NOTE — Progress Notes (Signed)
Office Visit Note   Patient: Cheryl Baker           Date of Birth: 27-Oct-1955           MRN: [id number] Visit Date: 08/04/2021              Requested by: Mayra Neer, MD [address] [place],  [place] [address] PCP: Mayra Neer, MD  Chief Complaint  Patient presents with   Right Knee - Routine Post Op    06/30/21 right knee scope and debridement       HPI: Patient is a 65 year old woman who presents in follow-up status post right knee arthroscopy.  She complains of knee weakness shaking feeling like Jell-O.  She is still using 1 crutch for ambulation.  Assessment & Plan: Visit Diagnoses:  1. Other meniscus derangements, posterior horn of medial meniscus, right knee     Plan: We will place an order for physical therapy for strengthening.  A refill prescription for Percocet.  At follow-up evaluate for return to work.  Patient does not have sufficient strength to return to work at this time.  Follow-Up Instructions: Return in about 4 weeks (around 09/01/2021).   Ortho Exam  Patient is alert, oriented, no adenopathy, well-dressed, normal affect, normal respiratory effort. Examination the portals are clean and dry she has a mild effusion there is no crepitation with range of motion there is no redness no cellulitis no signs of infection.  Imaging: No results found. No images are attached to the encounter.  Labs: No results found for: HGBA1C, ESRSEDRATE, CRP, LABURIC, REPTSTATUS, GRAMSTAIN, CULT, LABORGA   Lab Results  Component Value Date   ALBUMIN 4.0 04/29/2008    No results found for: MG No results found for: VD25OH  No results found for: PREALBUMIN CBC EXTENDED 04/29/2008  WBC 5.4  RBC 5.10  HGB 15.3(H)  HCT 44.6  PLT 220  NEUTROABS 2.8  LYMPHSABS 1.9     There is no height or weight on file to calculate BMI.  Orders:  No orders of the defined types were placed in this encounter.  No orders of the defined types were placed  in this encounter.    Procedures: No procedures performed  Clinical Data: No additional findings.  ROS:  All other systems negative, except as noted in the HPI. Review of Systems  Objective: Vital Signs: There were no vitals taken for this visit.  Specialty Comments:  No specialty comments available.  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 No pertinent family history.  Past Surgical History:  Procedure Laterality Date   ABDOMINAL HYSTERECTOMY     ACHILLES TENDON REPAIR Left    ANKLE SURGERY     KNEE ARTHROSCOPY WITH MEDIAL MENISECTOMY Right 06/30/2021   Procedure: RIGHT KNEE ARTHROSCOPY AND DEBRIDEMENT of medial meniscus tear;  Surgeon: Newt Minion, MD;  Location: [facility];  Service: Orthopedics;  Laterality: Right;   Social History   Occupational History   Not on file  Tobacco Use   Smoking status: Never   Smokeless tobacco: Never  Substance and Sexual Activity  Alcohol use: No   Drug use: No   Sexual activity: Not on file

## 2021-08-07 ENCOUNTER — Other Ambulatory Visit: Payer: Self-pay

## 2021-08-07 ENCOUNTER — Encounter: Payer: Self-pay | Admitting: Rehabilitative and Restorative Service Providers"

## 2021-08-07 ENCOUNTER — Ambulatory Visit: Payer: BC Managed Care – PPO | Admitting: Rehabilitative and Restorative Service Providers"

## 2021-08-07 DIAGNOSIS — R262 Difficulty in walking, not elsewhere classified: Secondary | ICD-10-CM | POA: Diagnosis not present

## 2021-08-07 DIAGNOSIS — M6281 Muscle weakness (generalized): Secondary | ICD-10-CM | POA: Diagnosis not present

## 2021-08-07 DIAGNOSIS — R600 Localized edema: Secondary | ICD-10-CM | POA: Diagnosis not present

## 2021-08-07 DIAGNOSIS — M25561 Pain in right knee: Secondary | ICD-10-CM

## 2021-08-07 DIAGNOSIS — M25661 Stiffness of right knee, not elsewhere classified: Secondary | ICD-10-CM | POA: Diagnosis not present

## 2021-08-07 NOTE — Therapy (Signed)
[facility] [address] [place], [place], [address] Phone: [phone]   Fax:  [phone]  Physical Therapy Evaluation  Patient Details  Name: Cheryl Baker MRN: [id number] Date of Birth: [birth year] Referring Provider (PT): Nadara Mustard MD   Encounter Date: 08/07/2021   PT End of Session - 08/07/21 1033     Visit Number 1    Number of Visits 14    Date for PT Re-Evaluation 10/02/21    Progress Note Due on Visit 10    PT Start Time 0932    PT Stop Time 1032    PT Time Calculation (min) 60 min    Activity Tolerance Patient tolerated treatment well    Behavior During Therapy [facility] for tasks assessed/performed             Past Medical History:  Diagnosis Date   GERD (gastroesophageal reflux disease)    Hyperlipidemia    Hypertension    MMT (medial meniscus tear)    right   Pre-diabetes     Past Surgical History:  Procedure Laterality Date   ABDOMINAL HYSTERECTOMY     ACHILLES TENDON REPAIR Left    ANKLE SURGERY     KNEE ARTHROSCOPY WITH MEDIAL MENISECTOMY Right 06/30/2021   Procedure: RIGHT KNEE ARTHROSCOPY AND DEBRIDEMENT of medial meniscus tear;  Surgeon: Nadara Mustard, MD;  Location: [facility];  Service: Orthopedics;  Laterality: Right;    There were no vitals filed for this visit.    Subjective Assessment - 08/07/21 1026     Subjective Cheryl Baker had her R knee scoped 06/30/2021.  She is using a single axillary crutch and feels R knee instability as she "does not trust it (the R knee)."  She would like to get stronger, walk normally and have much less pain.    Pertinent History HTN, HLD, Lt achilles tendon repair    Limitations Sitting;Walking;Lifting;House hold activities;Standing    How long can you sit comfortably? 15 minutes    How long can you stand comfortably? 5 minutes    How long can you walk comfortably? Short distances in the house    Patient Stated Goals See above    Currently in Pain?  Yes    Pain Score 6     Pain Location Knee    Pain Orientation Right    Pain Descriptors / Indicators Aching;Throbbing;Constant;Sore;Tightness    Pain Type Surgical pain    Pain Radiating Towards NA    Pain Onset More than a month ago    Pain Frequency Constant    Aggravating Factors  Prolonged postures and WB function    Pain Relieving Factors Change of position, ice and pain meds    Effect of Pain on Daily Activities Uses a single axillary crutch with gait, limited WB and sitting endurance    Multiple Pain Sites No                [facility] PT Assessment - 08/07/21 0001       Assessment   Medical Diagnosis s/p R knee arthroscopy    Referring Provider (PT) Nadara Mustard MD    Onset Date/Surgical Date 06/30/21    Next MD Visit In 4 weeks      Precautions   Precautions Knee    Precaution Comments Single crutch WBAT      Restrictions   Weight Bearing Restrictions No      Balance Screen   Has the patient fallen in the past 6  months No    Has the patient had a decrease in activity level because of a fear of falling?  No    Is the patient reluctant to leave their home because of a fear of falling?  No      Home Environment   Living Environment Private residence    Available Help at Discharge Family    Additional Comments Stairs      Prior Function   Level of Independence Independent    Vocation Full time employment    Press photographer    Leisure Walk, maintain the house      Cognition   Overall Cognitive Status Within Functional Limits for tasks assessed      Observation/Other Assessments   Focus on Therapeutic Outcomes (FOTO)  29 (Goal 55 in 14 visits)      ROM / Strength   AROM / PROM / Strength AROM;Strength      AROM   Overall AROM  Deficits    AROM Assessment Site Knee    Right/Left Knee Left;Right    Right Knee Extension -3    Right Knee Flexion 93    Left Knee Extension 0    Left Knee Flexion 134      Strength   Overall Strength  Deficits    Strength Assessment Site Knee    Right/Left Knee Left;Right    Right Knee Extension --   21.2 pounds   Left Knee Extension --   43.5 pounds                       Objective measurements completed on examination: See above findings.       [facility] PT Treatment/Exercise - 08/07/21 0001       Exercises   Exercises Knee/Hip      Knee/Hip Exercises: Seated   Other Seated Knee/Hip Exercises Tailgate knee flexion 3 minutes      Knee/Hip Exercises: Supine   Quad Sets Strengthening;Both;2 sets;10 reps;Limitations    Quad Sets Limitations 5 seconds, toes back, press knees down and tighten thighs                     PT Education - 08/07/21 1032     Education Details Answered a lot of questions about the surgery, cartilage, arthritis and PT.  Reviewed exam findings, knee anatomy and starter HEP.    Person(s) Educated Patient    Methods Explanation;Demonstration;Tactile cues;Verbal cues;Handout    Comprehension Verbalized understanding;Need further instruction;Returned demonstration;Verbal cues required;Tactile cues required              PT Short Term Goals - 08/07/21 1042       PT SHORT TERM GOAL #1   Title Improve R knee AROM to 0-110 degrees.    Baseline -3 to 93 degrees    Time 4    Period Weeks    Status New    Target Date 09/04/21      PT SHORT TERM GOAL #2   Title Improve R quadriceps strength to 35 pounds    Baseline 21 pounds    Time 4    Period Weeks    Status New    Target Date 09/04/21               PT Long Term Goals - 08/07/21 1043       PT LONG TERM GOAL #1   Title Improve FOTO to 55.    Baseline 29  Time 8    Period Weeks    Status New    Target Date 10/02/21      PT LONG TERM GOAL #2   Title Improve R knee pain to consistently 0-3/10 on the Numeric Pain Rating Scale.    Baseline 6/10    Time 8    Period Weeks    Status New    Target Date 10/02/21      PT LONG TERM GOAL #3   Title  Improve R knee flexion AROM to 120 degrees.    Baseline 93 degrees    Time 8    Period Weeks    Status New    Target Date 10/02/21      PT LONG TERM GOAL #4   Title Improve R quadriceps strength to 64 pounds (age/body weight specific norms).    Baseline 21 pounds    Time 8    Period Weeks    Status New    Target Date 10/02/21      PT LONG TERM GOAL #5   Title Cheryl Baker will be independent with her long-term HEP at DC.    Baseline Started today    Time 8    Period Weeks    Target Date 10/02/21                    Plan - 08/07/21 1034     Clinical Impression Statement Cheryl Baker has edema, AROM and quadriceps strength impairments limiting her function and independence s/p R knee arthroscopy.  Her prognosis to meet the below listed goals is good with consistent attendance and adequate HEP compliance.    Personal Factors and Comorbidities Comorbidity 3+    Comorbidities HTN, HLD, hx Lt achilles tendon repair    Examination-Activity Limitations Stairs;Stand;Bed Mobility;Bend;Sit;Transfers;Sleep;Carry;Locomotion Level;Squat    Examination-Participation Restrictions Interpersonal Relationship;Occupation;Cleaning;Community Activity;Driving    Stability/Clinical Decision Making Stable/Uncomplicated    Clinical Decision Making Low    Rehab Potential Good    PT Frequency Other (comment)   1-2X/week   PT Duration 8 weeks    PT Treatment/Interventions ADLs/Self Care Home Management;Electrical Stimulation;Cryotherapy;Gait training;Stair training;Therapeutic activities;Neuromuscular re-education;Therapeutic exercise;Balance training;Patient/family education;Manual techniques;Vasopneumatic Device;Passive range of motion    PT Next Visit Plan Review HEP, bike, leg press (light), 90-30 knee extensions with slow eccentrics (light weights)    PT Home Exercise Plan Access Code: [email or web address]    Consulted and Agree with Plan of Care Patient             Patient will benefit from skilled  therapeutic intervention in order to improve the following deficits and impairments:  Abnormal gait, Decreased activity tolerance, Decreased endurance, Decreased range of motion, Difficulty walking, Decreased strength, Increased edema, Impaired flexibility, Impaired perceived functional ability, Pain  Visit Diagnosis: Difficulty in walking, not elsewhere classified  Localized edema  Muscle weakness (generalized)  Stiffness of right knee, not elsewhere classified  Acute pain of right knee     Problem List Patient Active Problem List   Diagnosis Date Noted   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 [place], [place], [address] Phone: [phone]   Fax:  [phone]  Name: Cheryl Baker MRN: [id number] Date of Birth: June 12, 1956

## 2021-08-07 NOTE — Patient Instructions (Signed)
Access Code: [email or web address] URL: [email or web address] Date: 08/07/2021 Prepared by: Pauletta Browns  Exercises Supine Quadricep Sets - 3-5 x daily - 7 x weekly - 2-3 sets - 10 reps - 5 second hold Seated Knee Flexion AAROM - 3-5 x daily - 7 x weekly - 1 sets - 1 reps - 3 minutes hold

## 2021-08-11 ENCOUNTER — Other Ambulatory Visit: Payer: Self-pay

## 2021-08-11 ENCOUNTER — Ambulatory Visit: Payer: BC Managed Care – PPO | Admitting: Rehabilitative and Restorative Service Providers"

## 2021-08-11 ENCOUNTER — Encounter: Payer: Self-pay | Admitting: Rehabilitative and Restorative Service Providers"

## 2021-08-11 DIAGNOSIS — M25661 Stiffness of right knee, not elsewhere classified: Secondary | ICD-10-CM | POA: Diagnosis not present

## 2021-08-11 DIAGNOSIS — R262 Difficulty in walking, not elsewhere classified: Secondary | ICD-10-CM

## 2021-08-11 DIAGNOSIS — H40013 Open angle with borderline findings, low risk, bilateral: Secondary | ICD-10-CM | POA: Diagnosis not present

## 2021-08-11 DIAGNOSIS — R600 Localized edema: Secondary | ICD-10-CM | POA: Diagnosis not present

## 2021-08-11 DIAGNOSIS — M25561 Pain in right knee: Secondary | ICD-10-CM

## 2021-08-11 DIAGNOSIS — M6281 Muscle weakness (generalized): Secondary | ICD-10-CM

## 2021-08-11 NOTE — Therapy (Signed)
[facility] Physical Therapy [address] [place], [place], [address] Phone: [phone]   Fax:  [phone]  Physical Therapy Treatment  Patient Details  Name: Cheryl Baker MRN: [id number] Date of Birth: 1956/01/31 Referring Provider (PT): Nadara Mustard MD   Encounter Date: 08/11/2021   PT End of Session - 08/11/21 1103     Visit Number 2    Number of Visits 14    Date for PT Re-Evaluation 10/02/21    Progress Note Due on Visit 10    PT Start Time 1102    PT Stop Time 1142    PT Time Calculation (min) 40 min    Activity Tolerance Patient tolerated treatment well    Behavior During Therapy [facility] for tasks assessed/performed             Past Medical History:  Diagnosis Date   GERD (gastroesophageal reflux disease)    Hyperlipidemia    Hypertension    MMT (medial meniscus tear)    right   Pre-diabetes     Past Surgical History:  Procedure Laterality Date   ABDOMINAL HYSTERECTOMY     ACHILLES TENDON REPAIR Left    ANKLE SURGERY     KNEE ARTHROSCOPY WITH MEDIAL MENISECTOMY Right 06/30/2021   Procedure: RIGHT KNEE ARTHROSCOPY AND DEBRIDEMENT of medial meniscus tear;  Surgeon: Nadara Mustard, MD;  Location: [facility];  Service: Orthopedics;  Laterality: Right;    There were no vitals filed for this visit.   Subjective Assessment - 08/11/21 1112     Subjective Pt. indicated ache in knee around 6-7/10 today.  Pt. indicated she still felt instability in knee.  Pt. arrived using crutch in Rt arm.    Pertinent History HTN, HLD, Lt achilles tendon repair    Limitations Sitting;Walking;Lifting;House hold activities;Standing    Currently in Pain? Yes    Pain Score 6     Pain Location Knee    Pain Orientation Right    Pain Descriptors / Indicators Aching;Throbbing;Constant;Sore    Pain Type Surgical pain    Pain Onset More than a month ago    Pain Frequency Constant    Aggravating Factors  ache, standing    Pain Relieving  Factors rest                [facility] PT Assessment - 08/11/21 0001       Assessment   Medical Diagnosis s/p Rt knee arthroscopy    Referring Provider (PT) Nadara Mustard MD    Onset Date/Surgical Date 06/30/21      Ambulation/Gait   Gait Comments Arrived c use of axillary crutch in Rt UE c step to gait pattern.  Extensive cues and education given on procedures and sequencing for use in Lt arm and encouragement for reciprocal gait pattern step to and progression towards step through as tolerated.  Good stability noted throughout c supervision and cues required at most while performing.  Performed 50 ft x 1 c axillary crutch, 50 ft x 2 c SPC - all in Lt UE                           [facility] Adult PT Treatment/Exercise - 08/11/21 0001       Exercises   Exercises Other Exercises    Other Exercises  Verbal review of existing HEP      Knee/Hip Exercises: Aerobic   Recumbent Bike Full revolutions below bike machine turn on  RPM x 9 mins      Knee/Hip Exercises: Machines for Strengthening   Cybex Leg Press Double leg 50 lbs x 10, 62 lbs x 10.  Single leg Rt 25 lbs 2 x 10      Knee/Hip Exercises: Seated   Other Seated Knee/Hip Exercises Tailgate knee flexion x 5 (reviewed for home)    Sit to Sand without UE support;5 reps   18 inch chair s UE assist     Knee/Hip Exercises: Supine   Quad Sets Limitations Verbal review for home                     PT Education - 08/11/21 1143     Education Details Assistive device use for walking in Lt arm    Person(s) Educated Patient    Methods Explanation;Demonstration;Verbal cues    Comprehension Returned demonstration;Verbalized understanding              PT Short Term Goals - 08/11/21 1143       PT SHORT TERM GOAL #1   Title Improve Rt knee AROM to 0-110 degrees.    Baseline -3 to 93 degrees    Time 4    Period Weeks    Status On-going    Target Date 09/04/21      PT SHORT TERM GOAL #2   Title Improve  Rt quadriceps strength to 35 pounds    Time 4    Period Weeks    Status On-going    Target Date 09/04/21               PT Long Term Goals - 08/07/21 1043       PT LONG TERM GOAL #1   Title Improve FOTO to 55.    Baseline 29    Time 8    Period Weeks    Status New    Target Date 10/02/21      PT LONG TERM GOAL #2   Title Improve R knee pain to consistently 0-3/10 on the Numeric Pain Rating Scale.    Baseline 6/10    Time 8    Period Weeks    Status New    Target Date 10/02/21      PT LONG TERM GOAL #3   Title Improve R knee flexion AROM to 120 degrees.    Baseline 93 degrees    Time 8    Period Weeks    Status New    Target Date 10/02/21      PT LONG TERM GOAL #4   Title Improve R quadriceps strength to 64 pounds (age/body weight specific norms).    Baseline 21 pounds    Time 8    Period Weeks    Status New    Target Date 10/02/21      PT LONG TERM GOAL #5   Title Sigrid will be independent with her long-term HEP at [place].    Baseline Started today    Time 8    Period Weeks    Target Date 10/02/21                   Plan - 08/11/21 1129     Clinical Impression Statement Extended time spent in clinic today on gait training for crutch /SPC use in Lt hand for reciprocal gait pattern and encouragement and cues for progression from step to gait pattern to step through gait pattern.  Hesitancy noted throughout with Pt. able to perform successful  step to pattern c crutch, cane in Lt arm c good techniques.  Continued strengthening and progression on intervention indicated to improve towards goals.    Personal Factors and Comorbidities Comorbidity 3+    Comorbidities HTN, HLD, hx Lt achilles tendon repair    Examination-Activity Limitations Stairs;Stand;Bed Mobility;Bend;Sit;Transfers;Sleep;Carry;Locomotion Level;Squat    Examination-Participation Restrictions Interpersonal Relationship;Occupation;Cleaning;Community Activity;Driving    Stability/Clinical  Decision Making Stable/Uncomplicated    Rehab Potential Good    PT Frequency Other (comment)   1-2X/week   PT Duration 8 weeks    PT Treatment/Interventions ADLs/Self Care Home Management;Electrical Stimulation;Cryotherapy;Gait training;Stair training;Therapeutic activities;Neuromuscular re-education;Therapeutic exercise;Balance training;Patient/family education;Manual techniques;Vasopneumatic Device;Passive range of motion    PT Next Visit Plan Recheck use of assistive device in Lt UE, contiue progressive quad strengthening slow but steadily (possibly add LAQ), static balance progression    PT Home Exercise Plan Access Code: [email or web address]    Consulted and Agree with Plan of Care Patient             Patient will benefit from skilled therapeutic intervention in order to improve the following deficits and impairments:  Abnormal gait, Decreased activity tolerance, Decreased endurance, Decreased range of motion, Difficulty walking, Decreased strength, Increased edema, Impaired flexibility, Impaired perceived functional ability, Pain  Visit Diagnosis: Difficulty in walking, not elsewhere classified  Localized edema  Muscle weakness (generalized)  Stiffness of right knee, not elsewhere classified  Acute pain of right knee     Problem List Patient Active Problem List   Diagnosis Date Noted   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

## 2021-08-12 ENCOUNTER — Encounter: Payer: BC Managed Care – PPO | Admitting: Rehabilitative and Restorative Service Providers"

## 2021-08-14 ENCOUNTER — Other Ambulatory Visit: Payer: Self-pay

## 2021-08-14 ENCOUNTER — Ambulatory Visit: Payer: BC Managed Care – PPO | Admitting: Rehabilitative and Restorative Service Providers"

## 2021-08-14 ENCOUNTER — Encounter: Payer: Self-pay | Admitting: Rehabilitative and Restorative Service Providers"

## 2021-08-14 DIAGNOSIS — R600 Localized edema: Secondary | ICD-10-CM

## 2021-08-14 DIAGNOSIS — M25661 Stiffness of right knee, not elsewhere classified: Secondary | ICD-10-CM | POA: Diagnosis not present

## 2021-08-14 DIAGNOSIS — R262 Difficulty in walking, not elsewhere classified: Secondary | ICD-10-CM | POA: Diagnosis not present

## 2021-08-14 DIAGNOSIS — M25561 Pain in right knee: Secondary | ICD-10-CM

## 2021-08-14 DIAGNOSIS — M6281 Muscle weakness (generalized): Secondary | ICD-10-CM

## 2021-08-14 NOTE — Therapy (Signed)
[facility] Physical Therapy [address] [place], [place], [address] Phone: [phone]   Fax:  [phone]  Physical Therapy Treatment  Patient Details  Name: Cheryl Baker MRN: [id number] Date of Birth: 10-04-1955 Referring Provider (PT): Newt Minion MD   Encounter Date: 08/14/2021   PT End of Session - 08/14/21 0815     Visit Number 3    Number of Visits 14    Date for PT Re-Evaluation 10/02/21    Progress Note Due on Visit 10    PT Start Time 0801    PT Stop Time [id number]    PT Time Calculation (min) 40 min    Activity Tolerance Patient tolerated treatment well    Behavior During Therapy [facility] for tasks assessed/performed             Past Medical History:  Diagnosis Date   GERD (gastroesophageal reflux disease)    Hyperlipidemia    Hypertension    MMT (medial meniscus tear)    right   Pre-diabetes     Past Surgical History:  Procedure Laterality Date   ABDOMINAL HYSTERECTOMY     ACHILLES TENDON REPAIR Left    ANKLE SURGERY     KNEE ARTHROSCOPY WITH MEDIAL MENISECTOMY Right 06/30/2021   Procedure: RIGHT KNEE ARTHROSCOPY AND DEBRIDEMENT of medial meniscus tear;  Surgeon: Newt Minion, MD;  Location: [place];  Service: Orthopedics;  Laterality: Right;    There were no vitals filed for this visit.   Subjective Assessment - 08/14/21 0810     Subjective Pt. indicated ache about 6/10 today.  No sharp pain.  Pt. indicated crutch in Lt arm seemed to be better.    Pertinent History HTN, HLD, Lt achilles tendon repair    Limitations Sitting;Walking;Lifting;House hold activities;Standing    Currently in Pain? Yes    Pain Score 6     Pain Location Knee    Pain Orientation Right    Pain Descriptors / Indicators Sore    Pain Onset More than a month ago    Pain Frequency Intermittent    Aggravating Factors  weather, this morning stiffness insidous    Pain Relieving Factors OTC medicine                                [facility] Adult PT Treatment/Exercise - 08/14/21 0001       Neuro Re-ed    Neuro Re-ed Details  retro step 20x Rt leg posterior, tandem stance 1 min x 2 bilateral      Exercises   Other Exercises  Verbal cues for use of intermittent movement from HEP in midst of sitting activity to promote improved mobility.      Knee/Hip Exercises: Clinical research associate 30 seconds;3 reps;Both   incilne board     Knee/Hip Exercises: Aerobic   Recumbent Bike Lvl 1 RPM 30-50 10 mins, seat 2      Knee/Hip Exercises: Machines for Strengthening   Cybex Leg Press Double leg 56 lbs 2 x 15, single leg 31 lbs 2 x 10 Rt                       PT Short Term Goals - 08/11/21 1143       PT SHORT TERM GOAL #1   Title Improve Rt knee AROM to 0-110 degrees.    Baseline -3 to 93 degrees  Time 4    Period Weeks    Status On-going    Target Date 09/04/21      PT SHORT TERM GOAL #2   Title Improve Rt quadriceps strength to 35 pounds    Time 4    Period Weeks    Status On-going    Target Date 09/04/21               PT Long Term Goals - 08/07/21 1043       PT LONG TERM GOAL #1   Title Improve FOTO to 55.    Baseline 29    Time 8    Period Weeks    Status New    Target Date 10/02/21      PT LONG TERM GOAL #2   Title Improve R knee pain to consistently 0-3/10 on the Numeric Pain Rating Scale.    Baseline 6/10    Time 8    Period Weeks    Status New    Target Date 10/02/21      PT LONG TERM GOAL #3   Title Improve R knee flexion AROM to 120 degrees.    Baseline 93 degrees    Time 8    Period Weeks    Status New    Target Date 10/02/21      PT LONG TERM GOAL #4   Title Improve R quadriceps strength to 64 pounds (age/body weight specific norms).    Baseline 21 pounds    Time 8    Period Weeks    Status New    Target Date 10/02/21      PT LONG TERM GOAL #5   Title [name] will be independent with her long-term HEP at [place].     Baseline Started today    Time 8    Period Weeks    Target Date 10/02/21                   Plan - 08/14/21 [id number]     Clinical Impression Statement Pt. was able to progress in resistance intervention well today without pain complaints.  Improved control and stability in ambulation noted, with crutch and also independently within clinic c step through pattern and improved gait speed.  Continued plan of care to progress indicated.    Personal Factors and Comorbidities Comorbidity 3+    Comorbidities HTN, HLD, hx Lt achilles tendon repair    Examination-Activity Limitations Stairs;Stand;Bed Mobility;Bend;Sit;Transfers;Sleep;Carry;Locomotion Level;Squat    Examination-Participation Restrictions Interpersonal Relationship;Occupation;Cleaning;Community Activity;Driving    Stability/Clinical Decision Making Stable/Uncomplicated    Rehab Potential Good    PT Frequency Other (comment)   1-2X/week   PT Duration 8 weeks    PT Treatment/Interventions ADLs/Self Care Home Management;Electrical Stimulation;Cryotherapy;Gait training;Stair training;Therapeutic activities;Neuromuscular re-education;Therapeutic exercise;Balance training;Patient/family education;Manual techniques;Vasopneumatic Device;Passive range of motion    PT Next Visit Plan contiue progressive quad strengthening, static and dynamic stability intervention.    PT Home Exercise Plan Access Code: [email or web address]    Consulted and Agree with Plan of Care Patient             Patient will benefit from skilled therapeutic intervention in order to improve the following deficits and impairments:  Abnormal gait, Decreased activity tolerance, Decreased endurance, Decreased range of motion, Difficulty walking, Decreased strength, Increased edema, Impaired flexibility, Impaired perceived functional ability, Pain  Visit Diagnosis: Difficulty in walking, not elsewhere classified  Localized edema  Muscle weakness (generalized)  Stiffness of  right knee, not elsewhere classified  Acute pain of  right knee     Problem List Patient Active Problem List   Diagnosis Date Noted   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 [place], [place], [address] Phone: [phone]   Fax:  [phone]  Name: DELSY ETZKORN MRN: [id number] Date of Birth: Nov 23, 1955

## 2021-08-18 ENCOUNTER — Ambulatory Visit: Payer: BC Managed Care – PPO | Admitting: Rehabilitative and Restorative Service Providers"

## 2021-08-18 ENCOUNTER — Other Ambulatory Visit: Payer: Self-pay

## 2021-08-18 ENCOUNTER — Encounter: Payer: Self-pay | Admitting: Rehabilitative and Restorative Service Providers"

## 2021-08-18 DIAGNOSIS — M25561 Pain in right knee: Secondary | ICD-10-CM

## 2021-08-18 DIAGNOSIS — R600 Localized edema: Secondary | ICD-10-CM

## 2021-08-18 DIAGNOSIS — M25661 Stiffness of right knee, not elsewhere classified: Secondary | ICD-10-CM

## 2021-08-18 DIAGNOSIS — M6281 Muscle weakness (generalized): Secondary | ICD-10-CM

## 2021-08-18 DIAGNOSIS — R262 Difficulty in walking, not elsewhere classified: Secondary | ICD-10-CM | POA: Diagnosis not present

## 2021-08-18 NOTE — Therapy (Signed)
[facility] Physical Therapy [address] [place], [place], [address] Phone: [phone]   Fax:  [phone]  Physical Therapy Treatment  Patient Details  Name: Cheryl Baker MRN: [id number] Date of Birth: 29-Jul-1956 Referring Provider (PT): Newt Minion MD   Encounter Date: 08/18/2021   PT End of Session - 08/18/21 1028     Visit Number 4    Number of Visits 14    Date for PT Re-Evaluation 10/02/21    Progress Note Due on Visit 10    PT Start Time 1017    PT Stop Time 1057    PT Time Calculation (min) 40 min    Activity Tolerance Patient tolerated treatment well    Behavior During Therapy [facility] for tasks assessed/performed             Past Medical History:  Diagnosis Date   GERD (gastroesophageal reflux disease)    Hyperlipidemia    Hypertension    MMT (medial meniscus tear)    right   Pre-diabetes     Past Surgical History:  Procedure Laterality Date   ABDOMINAL HYSTERECTOMY     ACHILLES TENDON REPAIR Left    ANKLE SURGERY     KNEE ARTHROSCOPY WITH MEDIAL MENISECTOMY Right 06/30/2021   Procedure: RIGHT KNEE ARTHROSCOPY AND DEBRIDEMENT of medial meniscus tear;  Surgeon: Newt Minion, MD;  Location: [place];  Service: Orthopedics;  Laterality: Right;    There were no vitals filed for this visit.   Subjective Assessment - 08/18/21 1027     Subjective Pt. indicated a little pain complaints, not as high as it was.  Pt. mentioned swelling still noted.  Weakness after increased activity standing/walking at home.    Pertinent History HTN, HLD, Lt achilles tendon repair    Limitations Sitting;Walking;Lifting;House hold activities;Standing    Pain Score 4     Pain Location Knee    Pain Orientation Right    Pain Descriptors / Indicators Sore    Pain Type Surgical pain    Pain Onset More than a month ago    Pain Frequency Intermittent    Aggravating Factors  weakness c fatigue, general ache    Pain Relieving Factors  medicine, exercise seems to have helped.                               [place] Adult PT Treatment/Exercise - 08/18/21 0001       Neuro Re-ed    Neuro Re-ed Details  SLS c slider fwd/lateral/reverse c light pressure contralateral leg x 6 each bilateral      Exercises   Other Exercises  Additional time spent during intervention for cues and slower rep speeds.      Knee/Hip Exercises: Clinical research associate 30 seconds;3 reps;Both   incilne board     Knee/Hip Exercises: Aerobic   Other Aerobic UBE LE only lvl 4.0 7:30 mins      Knee/Hip Exercises: Seated   Long Arc Quad Right;3 sets;10 reps    Long Arc Quad Weight 3 lbs.    Other Seated Knee/Hip Exercises green hamstring curls Rt leg 3 x 10    Sit to Sand without UE support;2 sets;10 reps   slow sit down focus, 18 inch table height                      PT Short Term Goals - 08/11/21 1143  PT SHORT TERM GOAL #1   Title Improve Rt knee AROM to 0-110 degrees.    Baseline -3 to 93 degrees    Time 4    Period Weeks    Status On-going    Target Date 09/04/21      PT SHORT TERM GOAL #2   Title Improve Rt quadriceps strength to 35 pounds    Time 4    Period Weeks    Status On-going    Target Date 09/04/21               PT Long Term Goals - 08/07/21 1043       PT LONG TERM GOAL #1   Title Improve FOTO to 55.    Baseline 29    Time 8    Period Weeks    Status New    Target Date 10/02/21      PT LONG TERM GOAL #2   Title Improve R knee pain to consistently 0-3/10 on the Numeric Pain Rating Scale.    Baseline 6/10    Time 8    Period Weeks    Status New    Target Date 10/02/21      PT LONG TERM GOAL #3   Title Improve R knee flexion AROM to 120 degrees.    Baseline 93 degrees    Time 8    Period Weeks    Status New    Target Date 10/02/21      PT LONG TERM GOAL #4   Title Improve R quadriceps strength to 64 pounds (age/body weight specific norms).    Baseline 21  pounds    Time 8    Period Weeks    Status New    Target Date 10/02/21      PT LONG TERM GOAL #5   Title Cadince will be independent with her long-term HEP at [place].    Baseline Started today    Time 8    Period Weeks    Target Date 10/02/21                   Plan - 08/18/21 1052     Clinical Impression Statement Pt demonstrated safe independent ambulation in clinic without crutch today.  Continued progression of quad strengthening at this time c slow adjustments to avoid exacerbation of symptoms.  Continued inclusion of balance intervention indicated.    Personal Factors and Comorbidities Comorbidity 3+    Comorbidities HTN, HLD, hx Lt achilles tendon repair    Examination-Activity Limitations Stairs;Stand;Bed Mobility;Bend;Sit;Transfers;Sleep;Carry;Locomotion Level;Squat    Examination-Participation Restrictions Interpersonal Relationship;Occupation;Cleaning;Community Activity;Driving    Stability/Clinical Decision Making Stable/Uncomplicated    Rehab Potential Good    PT Frequency Other (comment)   1-2X/week   PT Duration 8 weeks    PT Treatment/Interventions ADLs/Self Care Home Management;Electrical Stimulation;Cryotherapy;Gait training;Stair training;Therapeutic activities;Neuromuscular re-education;Therapeutic exercise;Balance training;Patient/family education;Manual techniques;Vasopneumatic Device;Passive range of motion    PT Next Visit Plan Static and dynamic balance control improvements, quad strengthening (return to leg press SL)    PT Home Exercise Plan Access Code: [email or web address]    Consulted and Agree with Plan of Care Patient             Patient will benefit from skilled therapeutic intervention in order to improve the following deficits and impairments:  Abnormal gait, Decreased activity tolerance, Decreased endurance, Decreased range of motion, Difficulty walking, Decreased strength, Increased edema, Impaired flexibility, Impaired perceived functional ability,  Pain  Visit Diagnosis: Difficulty in walking, not elsewhere  classified  Localized edema  Muscle weakness (generalized)  Stiffness of right knee, not elsewhere classified  Acute pain of right knee     Problem List Patient Active Problem List   Diagnosis Date Noted   Old complex tear of medial meniscus of right knee    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 [place], [place], [address] Phone: [phone]   Fax:  [phone]  Name: Cheryl Baker MRN: [id number] Date of Birth: May 14, 1956

## 2021-08-20 ENCOUNTER — Encounter: Payer: Self-pay | Admitting: Rehabilitative and Restorative Service Providers"

## 2021-08-20 ENCOUNTER — Ambulatory Visit: Payer: BC Managed Care – PPO | Admitting: Rehabilitative and Restorative Service Providers"

## 2021-08-20 ENCOUNTER — Other Ambulatory Visit: Payer: Self-pay

## 2021-08-20 DIAGNOSIS — R262 Difficulty in walking, not elsewhere classified: Secondary | ICD-10-CM | POA: Diagnosis not present

## 2021-08-20 DIAGNOSIS — M25661 Stiffness of right knee, not elsewhere classified: Secondary | ICD-10-CM

## 2021-08-20 DIAGNOSIS — M6281 Muscle weakness (generalized): Secondary | ICD-10-CM | POA: Diagnosis not present

## 2021-08-20 DIAGNOSIS — M25561 Pain in right knee: Secondary | ICD-10-CM

## 2021-08-20 DIAGNOSIS — R600 Localized edema: Secondary | ICD-10-CM | POA: Diagnosis not present

## 2021-08-20 NOTE — Therapy (Signed)
[place], [place], [address] Phone: [phone]   Fax:  [phone]  Physical Therapy Treatment  Patient Details  Name: Cheryl Baker MRN: [id number] Date of Birth: [birth year] Referring Provider (PT): Newt Minion MD   Encounter Date: 08/20/2021   PT End of Session - 08/20/21 1202     Visit Number 5    Number of Visits 14    Date for PT Re-Evaluation 10/02/21    Progress Note Due on Visit 10    PT Start Time 0932    PT Stop Time [id number]    PT Time Calculation (min) 43 min    Activity Tolerance Patient tolerated treatment well;No increased pain    Behavior During Therapy WFL for tasks assessed/performed             Past Medical History:  Diagnosis Date   GERD (gastroesophageal reflux disease)    Hyperlipidemia    Hypertension    MMT (medial meniscus tear)    right   Pre-diabetes     Past Surgical History:  Procedure Laterality Date   ABDOMINAL HYSTERECTOMY     ACHILLES TENDON REPAIR Left    ANKLE SURGERY     KNEE ARTHROSCOPY WITH MEDIAL MENISECTOMY Right 06/30/2021   Procedure: RIGHT KNEE ARTHROSCOPY AND DEBRIDEMENT of medial meniscus tear;  Surgeon: Newt Minion, MD;  Location: [place];  Service: Orthopedics;  Laterality: Right;    There were no vitals filed for this visit.   Subjective Assessment - 08/20/21 0940     Subjective Manda is using the hydrocodone PRN, usually managing with tylenol.    Pertinent History HTN, HLD, Lt achilles tendon repair    Limitations Sitting;Walking;Lifting;House hold activities;Standing    How long can you sit comfortably? 15-20 minutes    How long can you stand comfortably? 15-20 minutes    How long can you walk comfortably? 5-10 minutes    Patient Stated Goals Feel more comfortable without the crutch, stairs easier, less pain    Currently in Pain? Yes    Pain Score 5     Pain Location Knee    Pain Orientation Right    Pain Descriptors  / Indicators Aching;Sore;Tightness    Pain Type Surgical pain    Pain Radiating Towards NA    Pain Onset More than a month ago    Pain Frequency Intermittent    Aggravating Factors  Prolonged WB and stairs    Pain Relieving Factors Exercise, ice, pain medication    Effect of Pain on Daily Activities Needs a cane with gait, limited WB and endurance with activities    Multiple Pain Sites No                [facility] PT Assessment - 08/20/21 0001       ROM / Strength   AROM / PROM / Strength AROM      AROM   Overall AROM  Deficits    AROM Assessment Site Knee    Right/Left Knee Right    Right Knee Extension -1    Right Knee Flexion 112                           [facility] Adult PT Treatment/Exercise - 08/20/21 0001       Neuro Re-ed    Neuro Re-ed Details  Tandem balance 5X 30 seconds (+ instruction and rest)      Exercises  Exercises Knee/Hip      Knee/Hip Exercises: Aerobic   Recumbent Bike Seat 2 for 8 minutes at level 2      Knee/Hip Exercises: Machines for Strengthening   Cybex Leg Press Double leg 75# and R knee 37# 15X each focus on full extension (avoid hyperextension) and slow eccentrics      Knee/Hip Exercises: Seated   Other Seated Knee/Hip Exercises Tailgate knee flexion 3 minutes      Knee/Hip Exercises: Supine   Quad Sets Strengthening;Both;2 sets;10 reps;Limitations    Quad Sets Limitations 5 seconds, toes back, press down and tighten thighs                     PT Education - 08/20/21 1201     Education Details Reviewed HEP with emphasis on quadriceps strength, edema control and knee flexion AROM.    Person(s) Educated Patient    Methods Explanation;Demonstration;Tactile cues;Verbal cues    Comprehension Verbal cues required;Need further instruction;Verbalized understanding;Returned demonstration;Tactile cues required              PT Short Term Goals - 08/11/21 1143       PT SHORT TERM GOAL #1   Title Improve Rt  knee AROM to 0-110 degrees.    Baseline -3 to 93 degrees    Time 4    Period Weeks    Status On-going    Target Date 09/04/21      PT SHORT TERM GOAL #2   Title Improve Rt quadriceps strength to 35 pounds    Time 4    Period Weeks    Status On-going    Target Date 09/04/21               PT Long Term Goals - 08/07/21 1043       PT LONG TERM GOAL #1   Title Improve FOTO to 55.    Baseline 29    Time 8    Period Weeks    Status New    Target Date 10/02/21      PT LONG TERM GOAL #2   Title Improve R knee pain to consistently 0-3/10 on the Numeric Pain Rating Scale.    Baseline 6/10    Time 8    Period Weeks    Status New    Target Date 10/02/21      PT LONG TERM GOAL #3   Title Improve R knee flexion AROM to 120 degrees.    Baseline 93 degrees    Time 8    Period Weeks    Status New    Target Date 10/02/21      PT LONG TERM GOAL #4   Title Improve R quadriceps strength to 64 pounds (age/body weight specific norms).    Baseline 21 pounds    Time 8    Period Weeks    Status New    Target Date 10/02/21      PT LONG TERM GOAL #5   Title Aiko will be independent with her long-term HEP at [place].    Baseline Started today    Time 8    Period Weeks    Target Date 10/02/21                   Plan - 08/20/21 1202     Clinical Impression Statement AROM is improving and Yaneliz is using her crutch less often (only outside the home).  HEP did require correction and emphasis remains edema  reduction, quadriceps strengthening and knee flexion AROM.  Continue POC to meet LTGs.    Personal Factors and Comorbidities Comorbidity 3+    Comorbidities HTN, HLD, hx Lt achilles tendon repair    Examination-Activity Limitations Stairs;Stand;Bed Mobility;Bend;Sit;Transfers;Sleep;Carry;Locomotion Level;Squat    Examination-Participation Restrictions Interpersonal Relationship;Occupation;Cleaning;Community Activity;Driving    Stability/Clinical Decision Making  Stable/Uncomplicated    Rehab Potential Good    PT Frequency Other (comment)   1-2X/week   PT Duration 8 weeks    PT Treatment/Interventions ADLs/Self Care Home Management;Electrical Stimulation;Cryotherapy;Gait training;Stair training;Therapeutic activities;Neuromuscular re-education;Therapeutic exercise;Balance training;Patient/family education;Manual techniques;Vasopneumatic Device;Passive range of motion    PT Next Visit Plan Edema control (vaso), progress quadriceps strength and knee flexion AROM, balance and function (stairs)    PT Home Exercise Plan Access Code: [email or web address]    Consulted and Agree with Plan of Care Patient             Patient will benefit from skilled therapeutic intervention in order to improve the following deficits and impairments:  Abnormal gait, Decreased activity tolerance, Decreased endurance, Decreased range of motion, Difficulty walking, Decreased strength, Increased edema, Impaired flexibility, Impaired perceived functional ability, Pain  Visit Diagnosis: Difficulty in walking, not elsewhere classified  Localized edema  Muscle weakness (generalized)  Stiffness of right knee, not elsewhere classified  Acute pain of right knee     Problem List Patient Active Problem List   Diagnosis Date Noted   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

## 2021-08-20 NOTE — Patient Instructions (Signed)
Access Code: [email or web address] URL: [email or web address] Date: 08/20/2021 Prepared by: Pauletta Browns  Exercises Supine Quadricep Sets - 3-5 x daily - 7 x weekly - 2-3 sets - 10 reps - 5 second hold Seated Knee Flexion AAROM - 3-5 x daily - 7 x weekly - 1 sets - 1 reps - 3 minutes hold

## 2021-08-23 IMAGING — MR MR FOOT*L* W/O CM
5 series · 40 of 40 positions shown · non-contrast
Comparison: Left foot x-rays dated October 03, 2019.

CLINICAL DATA: Posterior heel pain radiating into the calf with
limited weight-bearing since fall last [REDACTED].

EXAM:
MRI OF THE LEFT FOOT WITHOUT CONTRAST
TECHNIQUE: Multiplanar, multisequence MR imaging of the left ankle was
performed. No intravenous contrast was administered.

[Series 4: T2 fat-sat · axial · 3.0mm · 0.50mm/px · z∈[-101,+43]mm · 11 of 38 slices shown (1 of 2)]
[im 1/38]
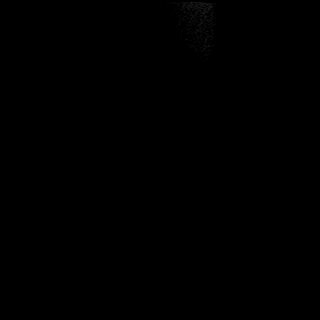
[im 4/38]
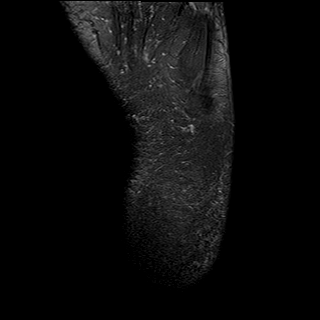
[im 8/38]
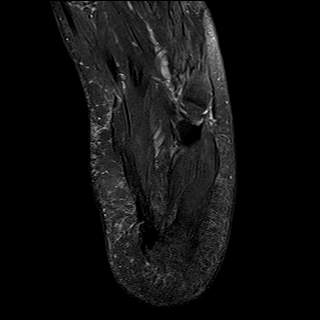
[im 12/38]
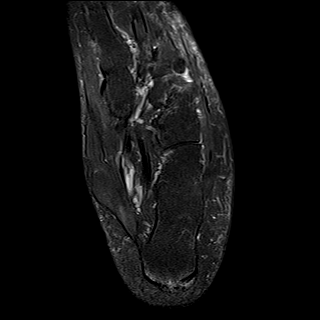
[im 15/38]
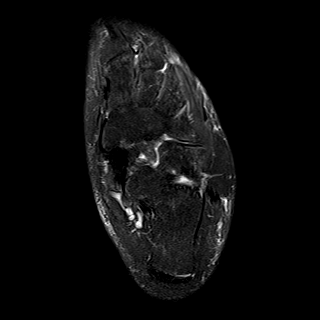
[im 19/38]
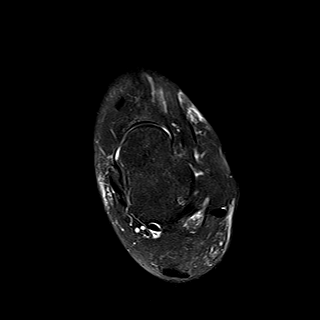
[im 23/38]
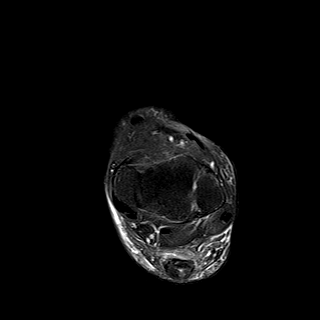
[im 26/38]
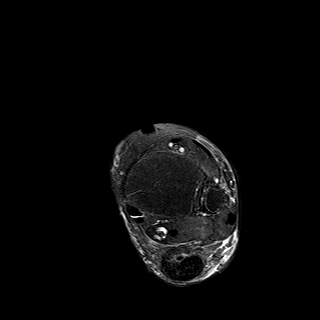
[im 30/38]
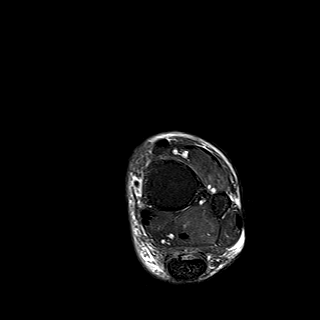
[im 34/38]
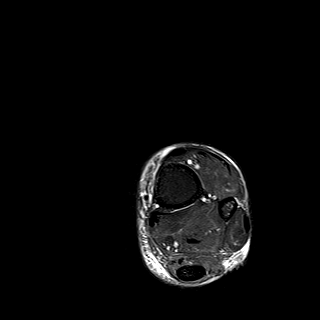
[im 38/38]
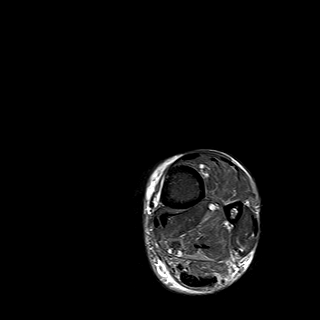

[Series 5: T1 · axial · 3.0mm · 0.50mm/px · z∈[-99,+44]mm · 10 of 38 slices shown (1 of 2)]
[im 1/38]
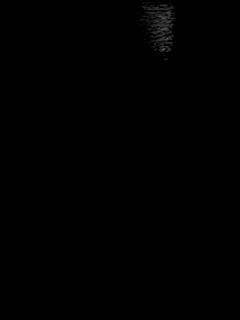
[im 5/38]
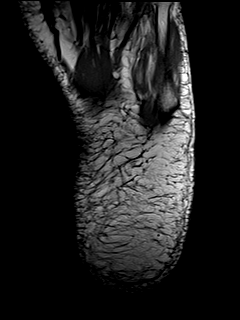
[im 9/38]
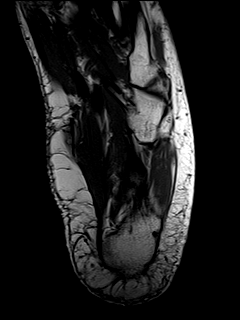
[im 13/38]
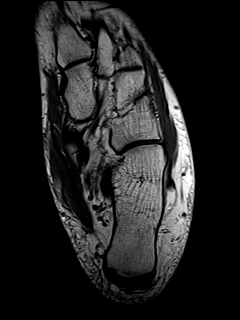
[im 17/38]
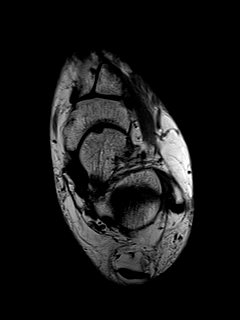
[im 21/38]
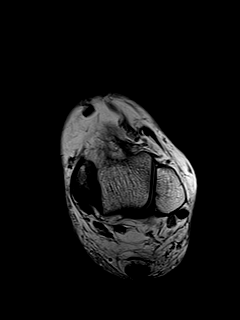
[im 25/38]
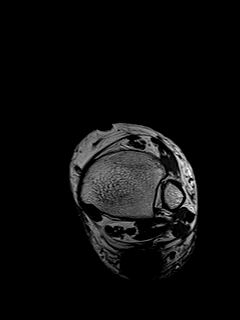
[im 29/38]
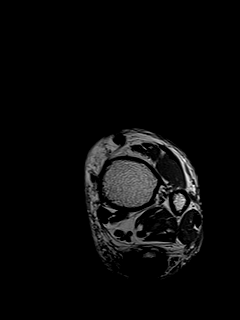
[im 33/38]
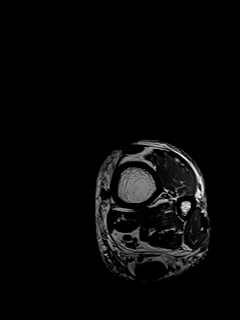
[im 38/38]
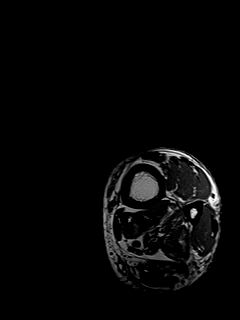

[Series 6: T1 · sagittal · 4.0mm · 0.56mm/px · 5 of 20 slices shown (2 of 2)]
[im 1/20]
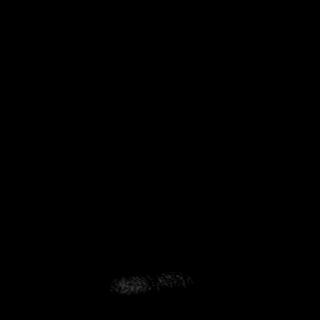
[im 5/20]
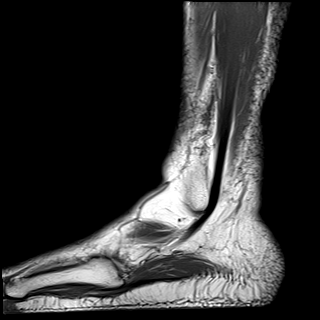
[im 10/20]
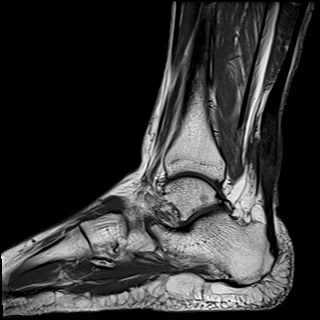
[im 15/20]
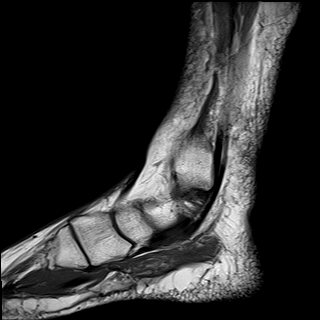
[im 20/20]
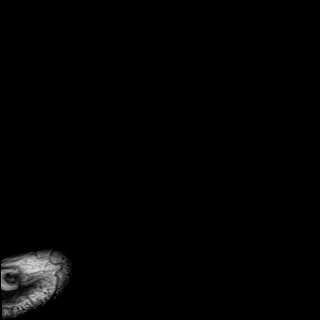

[Series 7: STIR · sagittal · 4.0mm · 0.35mm/px · 5 of 20 slices shown]
[im 1/20]
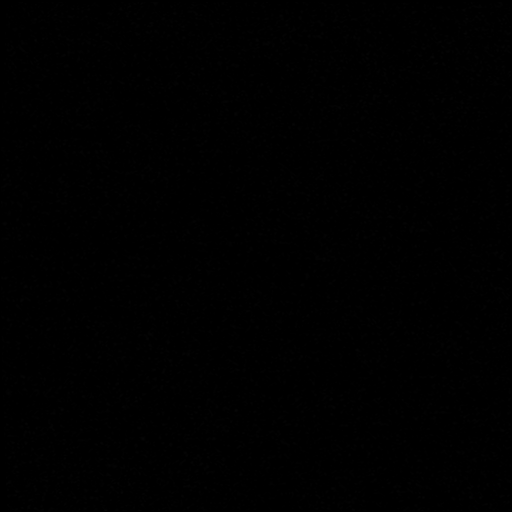
[im 5/20]
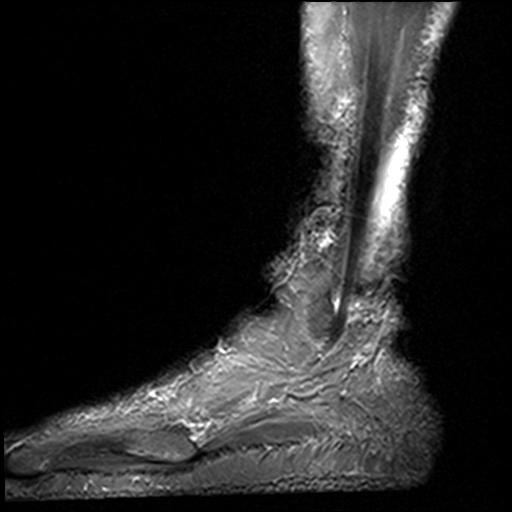
[im 10/20]
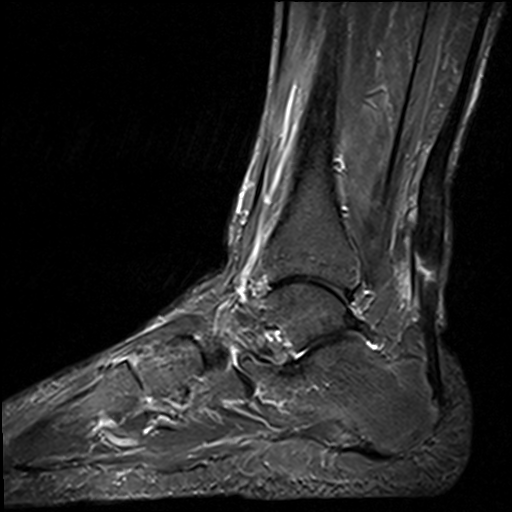
[im 15/20]
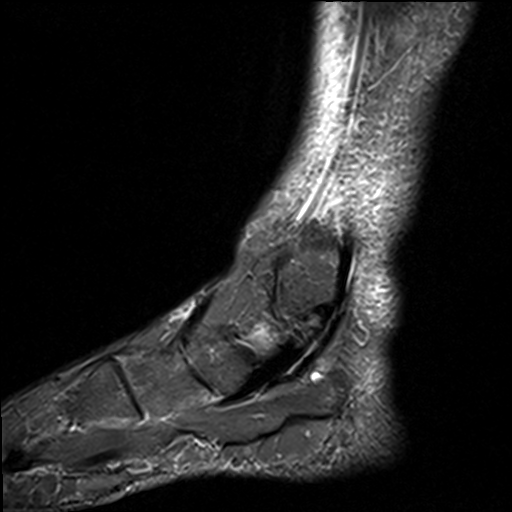
[im 20/20]
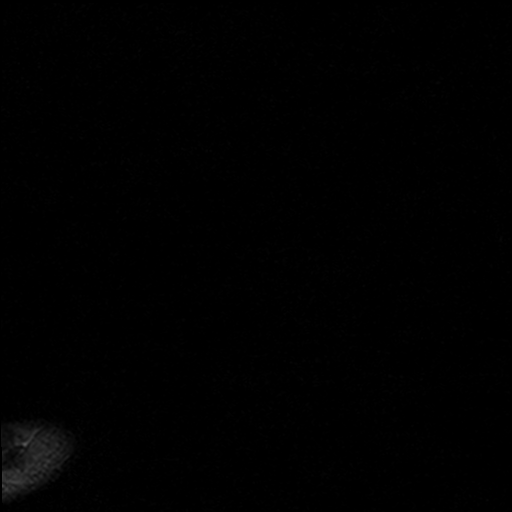

[Series 8: T2 fat-sat · coronal · 3.0mm · 0.50mm/px · 9 of 36 slices shown (2 of 2)]
[im 1/36]
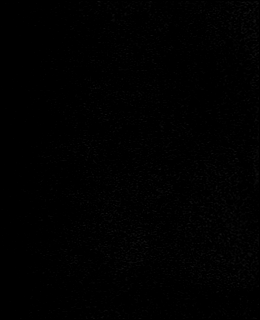
[im 5/36]
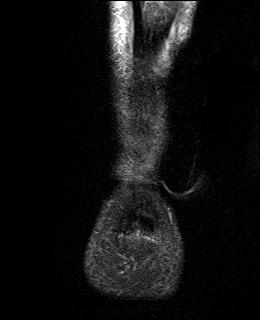
[im 9/36]
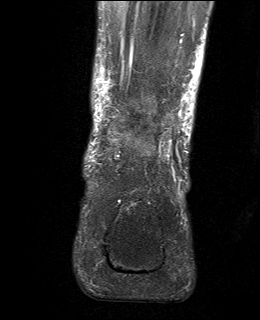
[im 14/36]
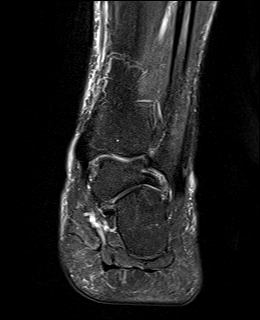
[im 18/36]
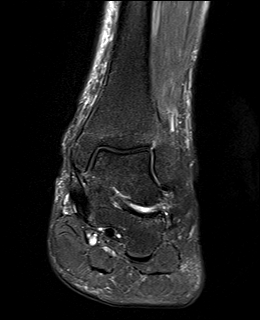
[im 22/36]
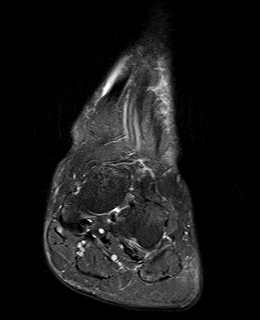
[im 27/36]
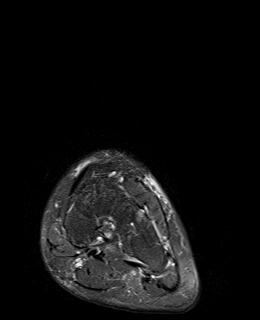
[im 31/36]
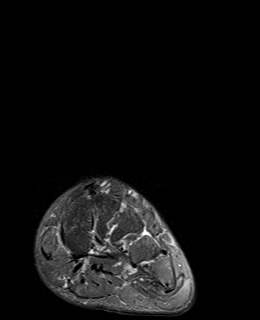
[im 36/36]
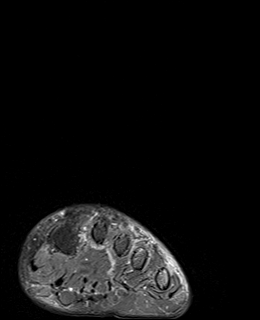

[40 of 40 positions shown; findings below may reference images not displayed]

FINDINGS: TENDONS

Peroneal: Peroneal longus tendon intact. Peroneal brevis intact.

Posteromedial: Posterior tibial tendon intact. Flexor hallucis
longus tendon intact. Flexor digitorum longus tendon intact.

Anterior: Tibialis anterior tendon intact. Extensor hallucis longus
tendon intact Extensor digitorum longus tendon intact.

Achilles: Severe tendinosis of the proximal and mid tendon with
focal high-grade tear of the mid tendon.

Plantar Fascia: Intact.

LIGAMENTS

Lateral: Anterior talofibular ligament intact. Calcaneofibular
ligament intact. Posterior talofibular ligament intact. Anterior and
posterior tibiofibular ligaments intact.

Medial: Deltoid ligament intact. Spring ligament intact.

CARTILAGE

Ankle Joint: No joint effusion. Normal ankle mortise. No chondral
defect.

Subtalar Joints/Sinus Tarsi: Normal subtalar joints. No subtalar
joint effusion. Normal sinus tarsi.

Bones: No acute fracture or dislocation. No suspicious bone lesion.
Small area of degenerative subchondral marrow edema in the talar
head with overlying cartilage defect.

Soft Tissue: Posterior lower leg and ankle soft tissue swelling. No
soft tissue mass or fluid collection.
IMPRESSION: 1. Severe tendinosis of the proximal and mid Achilles tendon with
focal high-grade tear of the mid tendon.

## 2021-08-24 ENCOUNTER — Other Ambulatory Visit: Payer: Self-pay

## 2021-08-24 ENCOUNTER — Ambulatory Visit (INDEPENDENT_AMBULATORY_CARE_PROVIDER_SITE_OTHER): Payer: BC Managed Care – PPO | Admitting: Physical Therapy

## 2021-08-24 DIAGNOSIS — M6281 Muscle weakness (generalized): Secondary | ICD-10-CM

## 2021-08-24 DIAGNOSIS — M25561 Pain in right knee: Secondary | ICD-10-CM

## 2021-08-24 DIAGNOSIS — R262 Difficulty in walking, not elsewhere classified: Secondary | ICD-10-CM

## 2021-08-24 DIAGNOSIS — R600 Localized edema: Secondary | ICD-10-CM | POA: Diagnosis not present

## 2021-08-24 DIAGNOSIS — M25661 Stiffness of right knee, not elsewhere classified: Secondary | ICD-10-CM | POA: Diagnosis not present

## 2021-08-24 NOTE — Therapy (Signed)
[place], [place], [address] Phone: [phone]   Fax:  [phone]  Physical Therapy Treatment  Patient Details  Name: Cheryl Baker MRN: [id number] Date of Birth: 11/27/1955 Referring Provider (PT): Newt Minion MD   Encounter Date: 08/24/2021   PT End of Session - 08/24/21 1942     Visit Number 6    Number of Visits 14    Date for PT Re-Evaluation 10/02/21    Progress Note Due on Visit 10    PT Start Time 1430    PT Stop Time 1513    PT Time Calculation (min) 43 min    Activity Tolerance Patient tolerated treatment well;No increased pain    Behavior During Therapy WFL for tasks assessed/performed             Past Medical History:  Diagnosis Date   GERD (gastroesophageal reflux disease)    Hyperlipidemia    Hypertension    MMT (medial meniscus tear)    right   Pre-diabetes     Past Surgical History:  Procedure Laterality Date   ABDOMINAL HYSTERECTOMY     ACHILLES TENDON REPAIR Left    ANKLE SURGERY     KNEE ARTHROSCOPY WITH MEDIAL MENISECTOMY Right 06/30/2021   Procedure: RIGHT KNEE ARTHROSCOPY AND DEBRIDEMENT of medial meniscus tear;  Surgeon: Newt Minion, MD;  Location: [facility];  Service: Orthopedics;  Laterality: Right;    There were no vitals filed for this visit.   Subjective Assessment - 08/24/21 1937     Subjective relays her knee is still swollen some but pain is only mild to moderate today.    Pertinent History HTN, HLD, Lt achilles tendon repair    Limitations Sitting;Walking;Lifting;House hold activities;Standing    How long can you sit comfortably? 15-20 minutes    How long can you stand comfortably? 15-20 minutes    How long can you walk comfortably? 5-10 minutes    Patient Stated Goals Feel more comfortable without the crutch, stairs easier, less pain    Pain Onset More than a month ago                               [facility] Adult PT  Treatment/Exercise - 08/24/21 0001       Ambulation/Gait   Gait Comments still arived with single crutch but she is able to ambulate at least 100 feet in clinic today without AD      Knee/Hip Exercises: Stretches   Active Hamstring Stretch Right;3 reps;30 seconds    Quad Stretch Right;30 seconds;3 reps    Gastroc Stretch 30 seconds;3 reps;Both      Knee/Hip Exercises: Aerobic   Recumbent Bike Seat 2 for 10 minutes at level 2      Knee/Hip Exercises: Machines for Strengthening   Cybex Leg Press Double leg 75# 2X15 and R knee 37# 2x15each focus on full extension (avoid hyperextension) and slow eccentrics      Knee/Hip Exercises: Standing   Heel Raises Limitations 15 heel and toe raises    Lateral Step Up Right;10 reps;Hand Hold: 2;Step Height: 6"    Forward Step Up Right;10 reps;Hand Hold: 1;Step Height: 6"      Knee/Hip Exercises: Seated   Long Arc Quad Right;20 reps    Long Arc Quad Weight 5 lbs.    Other Seated Knee/Hip Exercises Tailgate knee flexion 10 sec X10  Manual Therapy   Manual therapy comments Rt leg PROM flexion and extension and manual hamsting stretching                       PT Short Term Goals - 08/11/21 1143       PT SHORT TERM GOAL #1   Title Improve Rt knee AROM to 0-110 degrees.    Baseline -3 to 93 degrees    Time 4    Period Weeks    Status On-going    Target Date 09/04/21      PT SHORT TERM GOAL #2   Title Improve Rt quadriceps strength to 35 pounds    Time 4    Period Weeks    Status On-going    Target Date 09/04/21               PT Long Term Goals - 08/07/21 1043       PT LONG TERM GOAL #1   Title Improve FOTO to 55.    Baseline 29    Time 8    Period Weeks    Status New    Target Date 10/02/21      PT LONG TERM GOAL #2   Title Improve R knee pain to consistently 0-3/10 on the Numeric Pain Rating Scale.    Baseline 6/10    Time 8    Period Weeks    Status New    Target Date 10/02/21      PT LONG TERM  GOAL #3   Title Improve R knee flexion AROM to 120 degrees.    Baseline 93 degrees    Time 8    Period Weeks    Status New    Target Date 10/02/21      PT LONG TERM GOAL #4   Title Improve R quadriceps strength to 64 pounds (age/body weight specific norms).    Baseline 21 pounds    Time 8    Period Weeks    Status New    Target Date 10/02/21      PT LONG TERM GOAL #5   Title Cheryl Baker will be independent with her long-term HEP at [place].    Baseline Started today    Time 8    Period Weeks    Target Date 10/02/21                   Plan - 08/24/21 1943     Clinical Impression Statement We progressed Rt knee strength gradually today with good tolerance. We worked on additional stretching as she does not yet have full ROM in her Rt knee. PT recommending to continue POC.    Personal Factors and Comorbidities Comorbidity 3+    Comorbidities HTN, HLD, hx Lt achilles tendon repair    Examination-Activity Limitations Stairs;Stand;Bed Mobility;Bend;Sit;Transfers;Sleep;Carry;Locomotion Level;Squat    Examination-Participation Restrictions Interpersonal Relationship;Occupation;Cleaning;Community Activity;Driving    Stability/Clinical Decision Making Stable/Uncomplicated    Rehab Potential Good    PT Frequency Other (comment)   1-2X/week   PT Duration 8 weeks    PT Treatment/Interventions ADLs/Self Care Home Management;Electrical Stimulation;Cryotherapy;Gait training;Stair training;Therapeutic activities;Neuromuscular re-education;Therapeutic exercise;Balance training;Patient/family education;Manual techniques;Vasopneumatic Device;Passive range of motion    PT Next Visit Plan Edema control (vaso), progress quadriceps strength and knee flexion AROM, balance and function (stairs)    PT Home Exercise Plan Access Code: [email or web address]    Consulted and Agree with Plan of Care Patient  Patient will benefit from skilled therapeutic intervention in order to improve the following  deficits and impairments:  Abnormal gait, Decreased activity tolerance, Decreased endurance, Decreased range of motion, Difficulty walking, Decreased strength, Increased edema, Impaired flexibility, Impaired perceived functional ability, Pain  Visit Diagnosis: Difficulty in walking, not elsewhere classified  Localized edema  Muscle weakness (generalized)  Stiffness of right knee, not elsewhere classified  Acute pain of right knee     Problem List Patient Active Problem List   Diagnosis Date Noted   Old complex tear of medial meniscus of right knee    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 [place], [place], [address] Phone: [phone]   Fax:  [phone]  Name: Cheryl Baker MRN: [id number] Date of Birth: 30-Aug-1955

## 2021-08-26 ENCOUNTER — Ambulatory Visit: Payer: BC Managed Care – PPO | Admitting: Physical Therapy

## 2021-08-26 ENCOUNTER — Encounter: Payer: Self-pay | Admitting: Physical Therapy

## 2021-08-26 ENCOUNTER — Other Ambulatory Visit: Payer: Self-pay

## 2021-08-26 DIAGNOSIS — R600 Localized edema: Secondary | ICD-10-CM

## 2021-08-26 DIAGNOSIS — M25561 Pain in right knee: Secondary | ICD-10-CM

## 2021-08-26 DIAGNOSIS — M25661 Stiffness of right knee, not elsewhere classified: Secondary | ICD-10-CM

## 2021-08-26 DIAGNOSIS — M6281 Muscle weakness (generalized): Secondary | ICD-10-CM | POA: Diagnosis not present

## 2021-08-26 DIAGNOSIS — R262 Difficulty in walking, not elsewhere classified: Secondary | ICD-10-CM | POA: Diagnosis not present

## 2021-08-26 NOTE — Therapy (Signed)
[place], [place], [address] Phone: [phone]   Fax:  [phone]  Physical Therapy Treatment  Patient Details  Name: Cheryl Baker MRN: [id number] Date of Birth: 03/04/1956 Referring Provider (PT): Newt Minion MD   Encounter Date: 08/26/2021   PT End of Session - 08/26/21 1533     Visit Number 7    Number of Visits 14    Date for PT Re-Evaluation 10/02/21    Progress Note Due on Visit 10    PT Start Time 0231    PT Stop Time 0315    PT Time Calculation (min) 44 min    Activity Tolerance Patient tolerated treatment well;No increased pain    Behavior During Therapy WFL for tasks assessed/performed             Past Medical History:  Diagnosis Date   GERD (gastroesophageal reflux disease)    Hyperlipidemia    Hypertension    MMT (medial meniscus tear)    right   Pre-diabetes     Past Surgical History:  Procedure Laterality Date   ABDOMINAL HYSTERECTOMY     ACHILLES TENDON REPAIR Left    ANKLE SURGERY     KNEE ARTHROSCOPY WITH MEDIAL MENISECTOMY Right 06/30/2021   Procedure: RIGHT KNEE ARTHROSCOPY AND DEBRIDEMENT of medial meniscus tear;  Surgeon: Newt Minion, MD;  Location: [place];  Service: Orthopedics;  Laterality: Right;    There were no vitals filed for this visit.   Subjective Assessment - 08/26/21 1523     Subjective Pt. states stiffness in R knee and soreness since last visit. pt reports mild concordant pain as well.                [facility] PT Assessment - 08/26/21 0001       AROM   Right Knee Extension 3    Right Knee Flexion 111                           [facility] Adult PT Treatment/Exercise - 08/26/21 0001       Knee/Hip Exercises: Stretches   Sports administrator Right;30 seconds;3 reps    Sports administrator Limitations --   With stretch strap   Gastroc Stretch 30 seconds;3 reps;Both      Knee/Hip Exercises: Aerobic   Recumbent Bike Seat 2 for  10 minutes at level 2      Knee/Hip Exercises: Standing   Forward Step Up Right;10 reps;Hand Hold: 1;Step Height: 4"    Forward Step Up Limitations 2 sets    Other Standing Knee Exercises march walking and retro walking at countertop 2 round trips ea with intermit UE support PRN      Manual Therapy   Manual therapy comments Rt leg PROM manual hamstring stretching                       PT Short Term Goals - 08/11/21 1143       PT SHORT TERM GOAL #1   Title Improve Rt knee AROM to 0-110 degrees.    Baseline -3 to 93 degrees    Time 4    Period Weeks    Status On-going    Target Date 09/04/21      PT SHORT TERM GOAL #2   Title Improve Rt quadriceps strength to 35 pounds    Time 4    Period Weeks    Status  On-going    Target Date 09/04/21               PT Long Term Goals - 08/07/21 1043       PT LONG TERM GOAL #1   Title Improve FOTO to 55.    Baseline 29    Time 8    Period Weeks    Status New    Target Date 10/02/21      PT LONG TERM GOAL #2   Title Improve R knee pain to consistently 0-3/10 on the Numeric Pain Rating Scale.    Baseline 6/10    Time 8    Period Weeks    Status New    Target Date 10/02/21      PT LONG TERM GOAL #3   Title Improve R knee flexion AROM to 120 degrees.    Baseline 93 degrees    Time 8    Period Weeks    Status New    Target Date 10/02/21      PT LONG TERM GOAL #4   Title Improve R quadriceps strength to 64 pounds (age/body weight specific norms).    Baseline 21 pounds    Time 8    Period Weeks    Status New    Target Date 10/02/21      PT LONG TERM GOAL #5   Title Gearlene will be independent with her long-term HEP at [place].    Baseline Started today    Time 8    Period Weeks    Target Date 10/02/21                   Plan - 08/26/21 1603     Clinical Impression Statement Pt. presents with stiffness in R knee. Recumbent bike assisted in loosening knee stiffness. Slant board stretching, quad  stretching and PROM also helped to reduce stiffness in R knee. 6 inch step ups were too difficult today so we dropped down to 4 inch and Pt. was cued on proper form on 4in. step ups. Balance exercises were introduced and added to HEP. Quad and Hamstring stretches were completed before measurements taken. ROM measurments were a little less today likely due to increased edema.    Personal Factors and Comorbidities Comorbidity 3+    Comorbidities HTN, HLD, hx Lt achilles tendon repair    Examination-Activity Limitations Stairs;Stand;Bed Mobility;Bend;Sit;Transfers;Sleep;Carry;Locomotion Level;Squat    Examination-Participation Restrictions Interpersonal Relationship;Occupation;Cleaning;Community Activity;Driving    Stability/Clinical Decision Making Stable/Uncomplicated    Rehab Potential Good    PT Frequency Other (comment)   1-2X/week   PT Duration 8 weeks    PT Treatment/Interventions ADLs/Self Care Home Management;Electrical Stimulation;Cryotherapy;Gait training;Stair training;Therapeutic activities;Neuromuscular re-education;Therapeutic exercise;Balance training;Patient/family education;Manual techniques;Vasopneumatic Device;Passive range of motion    PT Next Visit Plan Follow up with pt. on balance exercises added to HEP. Dependent on stiffness/ pain increase step up height to 6in    PT Home Exercise Plan Access Code: [email or web address]    Consulted and Agree with Plan of Care Patient             Patient will benefit from skilled therapeutic intervention in order to improve the following deficits and impairments:  Abnormal gait, Decreased activity tolerance, Decreased endurance, Decreased range of motion, Difficulty walking, Decreased strength, Increased edema, Impaired flexibility, Impaired perceived functional ability, Pain  Visit Diagnosis: Difficulty in walking, not elsewhere classified  Localized edema  Muscle weakness (generalized)  Stiffness of right knee, not elsewhere  classified  Acute pain of right  knee     Problem List Patient Active Problem List   Diagnosis Date Noted   Old complex tear of medial meniscus of right knee    Central centrifugal scarring alopecia [id number]    Aune Adami Singer, Student-PT 08/26/2021, 4:09 PM  [facility] Physical Therapy [address] [place], [place], [address] Phone: [phone]   Fax:  [phone]  Name: DOMINQUE WICK MRN: [id number] Date of Birth: [birth year]

## 2021-09-02 ENCOUNTER — Encounter: Payer: Self-pay | Admitting: Rehabilitative and Restorative Service Providers"

## 2021-09-02 ENCOUNTER — Encounter: Payer: BC Managed Care – PPO | Admitting: Rehabilitative and Restorative Service Providers"

## 2021-09-02 ENCOUNTER — Other Ambulatory Visit: Payer: Self-pay

## 2021-09-02 ENCOUNTER — Ambulatory Visit: Payer: BC Managed Care – PPO | Admitting: Rehabilitative and Restorative Service Providers"

## 2021-09-02 DIAGNOSIS — M6281 Muscle weakness (generalized): Secondary | ICD-10-CM | POA: Diagnosis not present

## 2021-09-02 DIAGNOSIS — R600 Localized edema: Secondary | ICD-10-CM | POA: Diagnosis not present

## 2021-09-02 DIAGNOSIS — M25661 Stiffness of right knee, not elsewhere classified: Secondary | ICD-10-CM

## 2021-09-02 DIAGNOSIS — R262 Difficulty in walking, not elsewhere classified: Secondary | ICD-10-CM | POA: Diagnosis not present

## 2021-09-02 DIAGNOSIS — M25561 Pain in right knee: Secondary | ICD-10-CM

## 2021-09-02 NOTE — Therapy (Signed)
[facility] [place], [place], [address] Phone: [phone]   Fax:  [phone]  Physical Therapy Treatment/Progress note  Patient Details  Name: Cheryl Baker MRN: [id number] Date of Birth: 1956-05-09 Referring Provider (PT): Newt Minion MD   Encounter Date: 09/02/2021   Progress Note Reporting Period 08/07/2022 to 09/02/2021  See note below for Objective Data and Assessment of Progress/Goals.        PT End of Session - 09/02/21 0851     Visit Number 8    Number of Visits 14    Date for PT Re-Evaluation 10/02/21    Progress Note Due on Visit 10    PT Start Time 0844    PT Stop Time 0929    PT Time Calculation (min) 45 min    Activity Tolerance Patient tolerated treatment well;No increased pain    Behavior During Therapy WFL for tasks assessed/performed             Past Medical History:  Diagnosis Date   GERD (gastroesophageal reflux disease)    Hyperlipidemia    Hypertension    MMT (medial meniscus tear)    right   Pre-diabetes     Past Surgical History:  Procedure Laterality Date   ABDOMINAL HYSTERECTOMY     ACHILLES TENDON REPAIR Left    ANKLE SURGERY     KNEE ARTHROSCOPY WITH MEDIAL MENISECTOMY Right 06/30/2021   Procedure: RIGHT KNEE ARTHROSCOPY AND DEBRIDEMENT of medial meniscus tear;  Surgeon: Newt Minion, MD;  Location: [employer] [facility];  Service: Orthopedics;  Laterality: Right;    There were no vitals filed for this visit.   Subjective Assessment - 09/02/21 0849     Subjective Pt. indicated 3-4/10 pain at worst in last few days, stiffness noted more than pain overall. Pt. indicated using crutch for stability just in case.    Currently in Pain? Yes    Pain Score 3     Pain Location Knee    Pain Orientation Right    Pain Descriptors / Indicators Tightness;Sore    Pain Type Surgical pain    Pain Onset More than a month ago    Pain Frequency Intermittent    Aggravating  Factors  prolonged sitting stiffness    Pain Relieving Factors movement helps.                [facility] PT Assessment - 09/02/21 0001       Assessment   Medical Diagnosis s/p Rt knee arthroscopy    Referring Provider (PT) Newt Minion MD    Onset Date/Surgical Date 06/30/21      Observation/Other Assessments   Focus on Therapeutic Outcomes (FOTO)  update50%      Functional Tests   Functional tests Sit to Stand;Single leg stance      Single Leg Stance   Comments Rt SLS: 10 seconds      Sit to Stand   Comments able to perform from 18 inch chair s UE assist      AROM   Right Knee Extension -2   in seated LAQ   Right Knee Flexion 120   in supine heel slide     Strength   Right Knee Flexion 5/5    Right Knee Extension 4/5   26.9, 26.5   Left Knee Extension 5/5   38.5, 40.2     Ambulation/Gait   Gait Comments Arrived with crutch but able to safely ambulation in clinic on level surfaces  independently.                           [facility] Adult PT Treatment/Exercise - 09/02/21 0001       Neuro Re-ed    Neuro Re-ed Details  SLS 30 sec x 4 Rt leg c occasional hand touch on counter (detailed for home use if she felt safe)      Knee/Hip Exercises: Aerobic   Recumbent Bike Seat 2 lvl 2 10 mins      Knee/Hip Exercises: Machines for Strengthening   Cybex Knee Extension Full range (no pain noted) Double leg up, Rt leg eccentric 3 x 10 5 lbs    Cybex Leg Press Rt 43 lbs 2 x 15      Knee/Hip Exercises: Standing   Forward Step Up Step Height: 4";Right;15 reps   tried 6 inch but pain in knee was noted                      PT Short Term Goals - 09/02/21 0904       PT SHORT TERM GOAL #1   Title Improve Rt knee AROM to 0-110 degrees.    Time 4    Period Weeks    Status On-going    Target Date 09/04/21      PT SHORT TERM GOAL #2   Title Improve Rt quadriceps strength to 35 pounds    Time 4    Period Weeks    Status On-going    Target Date  09/04/21               PT Long Term Goals - 09/02/21 0905       PT LONG TERM GOAL #1   Title Improve FOTO to 55.    Time 8    Period Weeks    Status On-going    Target Date 10/02/21      PT LONG TERM GOAL #2   Title Improve Rt knee pain to consistently 0-3/10 on the Numeric Pain Rating Scale.    Time 8    Period Weeks    Status On-going    Target Date 10/02/21      PT LONG TERM GOAL #3   Title Improve Rt knee flexion AROM to 120 degrees.    Time 8    Period Weeks    Status Achieved    Target Date 10/02/21      PT LONG TERM GOAL #4   Title Improve Rt quadriceps strength to 64 pounds (age/body weight specific norms).    Time 8    Period Weeks    Status On-going    Target Date 10/02/21      PT LONG TERM GOAL #5   Title Cheryl Baker will be independent with her long-term HEP at [place].    Baseline Started today    Time 8    Period Weeks    Status On-going    Target Date 10/02/21                   Plan - 09/02/21 0902     Clinical Impression Statement Pt. has attended 8 visits overall during course of treatment.  See objective data for updated information regarding current presentation.  Pt. has continued to demonstrate progression in strength and control to this point compared to previous.  Emphasis on continued practice to build up confidence in independent ambulation.  Pt. to benefit from continued strengthening  and static/dynamic balance to improve function.    Personal Factors and Comorbidities Comorbidity 3+    Comorbidities HTN, HLD, hx Lt achilles tendon repair    Examination-Activity Limitations Stairs;Stand;Bed Mobility;Bend;Sit;Transfers;Sleep;Carry;Locomotion Level;Squat    Examination-Participation Restrictions Interpersonal Relationship;Occupation;Cleaning;Community Activity;Driving    Stability/Clinical Decision Making Stable/Uncomplicated    Rehab Potential Good    PT Frequency Other (comment)   1-2X/week   PT Duration 8 weeks    PT  Treatment/Interventions ADLs/Self Care Home Management;Electrical Stimulation;Cryotherapy;Gait training;Stair training;Therapeutic activities;Neuromuscular re-education;Therapeutic exercise;Balance training;Patient/family education;Manual techniques;Vasopneumatic Device;Passive range of motion    PT Next Visit Plan Static/dynamic balance intervention, progressive strengthening and stair navigation training to improve quad strength and balance    PT Home Exercise Plan Access Code: [email or web address]    Consulted and Agree with Plan of Care Patient             Patient will benefit from skilled therapeutic intervention in order to improve the following deficits and impairments:  Abnormal gait, Decreased activity tolerance, Decreased endurance, Decreased range of motion, Difficulty walking, Decreased strength, Increased edema, Impaired flexibility, Impaired perceived functional ability, Pain  Visit Diagnosis: Difficulty in walking, not elsewhere classified  Localized edema  Muscle weakness (generalized)  Stiffness of right knee, not elsewhere classified  Acute pain of right knee     Problem List Patient Active Problem List   Diagnosis Date Noted   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 [place], [place], [address] Phone: [phone]   Fax:  [phone]  Name: Cheryl Baker MRN: [id number] Date of Birth: 27-Jan-1956

## 2021-09-03 ENCOUNTER — Encounter: Payer: Self-pay | Admitting: Orthopedic Surgery

## 2021-09-03 ENCOUNTER — Ambulatory Visit (INDEPENDENT_AMBULATORY_CARE_PROVIDER_SITE_OTHER): Payer: BC Managed Care – PPO | Admitting: Orthopedic Surgery

## 2021-09-03 DIAGNOSIS — M23321 Other meniscus derangements, posterior horn of medial meniscus, right knee: Secondary | ICD-10-CM

## 2021-09-03 NOTE — Progress Notes (Signed)
° °  Office Visit Note   Patient: Cheryl Baker           Date of Birth: Nov 04, 1955           MRN: [id number] Visit Date: 09/03/2021              Requested by: Mayra Neer, MD [address] [place],  [place] [address] PCP: Mayra Neer, MD  Chief Complaint  Patient presents with   Right Knee - Routine Post Op    06/30/2021 right knee scope and deb      HPI: Patient is a 66 year old woman who presents in follow-up for arthroscopic debridement right knee she is ambulating with 1 crutch.  She states she has weakness.  Assessment & Plan: Visit Diagnoses:  1. Other meniscus derangements, posterior horn of medial meniscus, right knee     Plan: Patient will continue with her therapy.  She would like to return to work on September 21, 2021.  We will complete her disability paperwork to agree with this.  Follow-Up Instructions: Return in about 4 weeks (around 10/01/2021).   Ortho Exam  Patient is alert, oriented, no adenopathy, well-dressed, normal affect, normal respiratory effort. Examination patient has a mild effusion there is no crepitation with range of motion of her knee.  Imaging: No results found. No images are attached to the encounter.  Labs: No results found for: HGBA1C, ESRSEDRATE, CRP, LABURIC, REPTSTATUS, GRAMSTAIN, CULT, LABORGA   Lab Results  Component Value Date   ALBUMIN 4.0 04/29/2008    No results found for: MG No results found for: VD25OH  No results found for: PREALBUMIN CBC EXTENDED 04/29/2008  WBC 5.4  RBC 5.10  HGB 15.3(H)  HCT 44.6  PLT 220  NEUTROABS 2.8  LYMPHSABS 1.9     There is no height or weight on file to calculate BMI.  Orders:  No orders of the defined types were placed in this encounter.  No orders of the defined types were placed in this encounter.    Procedures: No procedures performed  Clinical Data: No additional findings.  ROS:  All other systems negative, except as noted in the  HPI. Review of Systems  Objective: Vital Signs: There were no vitals taken for this visit.  Specialty Comments:  No specialty comments available.  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 No pertinent family history.  Past Surgical History:  Procedure Laterality Date   ABDOMINAL HYSTERECTOMY     ACHILLES TENDON REPAIR Left    ANKLE SURGERY     KNEE ARTHROSCOPY WITH MEDIAL MENISECTOMY Right 06/30/2021   Procedure: RIGHT KNEE ARTHROSCOPY AND DEBRIDEMENT of medial meniscus tear;  Surgeon: Newt Minion, MD;  Location: [facility];  Service: Orthopedics;  Laterality: Right;   Social History   Occupational History   Not on file  Tobacco Use   Smoking status: Never   Smokeless tobacco: Never  Substance and Sexual Activity   Alcohol use: No   Drug use: No   Sexual activity: Not on file

## 2021-09-04 ENCOUNTER — Ambulatory Visit: Payer: BC Managed Care – PPO | Admitting: Rehabilitative and Restorative Service Providers"

## 2021-09-04 ENCOUNTER — Other Ambulatory Visit: Payer: Self-pay

## 2021-09-04 ENCOUNTER — Encounter: Payer: Self-pay | Admitting: Rehabilitative and Restorative Service Providers"

## 2021-09-04 ENCOUNTER — Telehealth: Payer: Self-pay

## 2021-09-04 DIAGNOSIS — R600 Localized edema: Secondary | ICD-10-CM | POA: Diagnosis not present

## 2021-09-04 DIAGNOSIS — M6281 Muscle weakness (generalized): Secondary | ICD-10-CM | POA: Diagnosis not present

## 2021-09-04 DIAGNOSIS — R262 Difficulty in walking, not elsewhere classified: Secondary | ICD-10-CM

## 2021-09-04 DIAGNOSIS — M25561 Pain in right knee: Secondary | ICD-10-CM

## 2021-09-04 DIAGNOSIS — M25661 Stiffness of right knee, not elsewhere classified: Secondary | ICD-10-CM

## 2021-09-04 NOTE — Patient Instructions (Signed)
Access Code: [email or web address] URL: [email or web address] Date: 09/04/2021 Prepared by: Pauletta Browns  Exercises Supine Quadricep Sets - 3-5 x daily - 7 x weekly - 2-3 sets - 10 reps - 5 second hold Seated Knee Flexion AAROM - 3-5 x daily - 7 x weekly - 1 sets - 1 reps - 3 minutes hold

## 2021-09-04 NOTE — Therapy (Signed)
[place], [place], [address] Phone: [phone]   Fax:  [phone]  Physical Therapy Treatment  Patient Details  Name: Cheryl Baker MRN: [id number] Date of Birth: June 17, 1956 Referring Provider (PT): Newt Minion MD   Encounter Date: 09/04/2021   PT End of Session - 09/04/21 0938     Visit Number 9    Number of Visits 14    Date for PT Re-Evaluation 10/02/21    Progress Note Due on Visit 10    PT Start Time 0846    PT Stop Time 0931    PT Time Calculation (min) 45 min    Activity Tolerance Patient tolerated treatment well;No increased pain    Behavior During Therapy WFL for tasks assessed/performed             Past Medical History:  Diagnosis Date   GERD (gastroesophageal reflux disease)    Hyperlipidemia    Hypertension    MMT (medial meniscus tear)    right   Pre-diabetes     Past Surgical History:  Procedure Laterality Date   ABDOMINAL HYSTERECTOMY     ACHILLES TENDON REPAIR Left    ANKLE SURGERY     KNEE ARTHROSCOPY WITH MEDIAL MENISECTOMY Right 06/30/2021   Procedure: RIGHT KNEE ARTHROSCOPY AND DEBRIDEMENT of medial meniscus tear;  Surgeon: Newt Minion, MD;  Location: [facility];  Service: Orthopedics;  Laterality: Right;    There were no vitals filed for this visit.   Subjective Assessment - 09/04/21 0852     Subjective Cheryl Baker reports she is "about ready" to try more activities without her crutch.    Pertinent History HTN, HLD, Lt achilles tendon repair    Limitations Sitting;Walking;Lifting;House hold activities;Standing    How long can you sit comfortably? 30 minutes    How long can you stand comfortably? 45 minutes    How long can you walk comfortably? 5-10 minutes    Patient Stated Goals Feel more comfortable without the crutch, stairs easier, less pain    Currently in Pain? Yes    Pain Score 3     Pain Location Knee    Pain Orientation Right    Pain  Descriptors / Indicators Sore;Tightness    Pain Type Surgical pain    Pain Radiating Towards NA    Pain Onset More than a month ago    Pain Frequency Intermittent    Aggravating Factors  Prolonged WB or prolonged postures    Pain Relieving Factors Exercises    Effect of Pain on Daily Activities Still uses crutch or cane with gait and limited WB endurance    Multiple Pain Sites No                               [facility] Adult PT Treatment/Exercise - 09/04/21 0001       Therapeutic Activites    Therapeutic Activities ADL's    ADL's Step-up and over 4 inch step slow eccentrics      Neuro Re-ed    Neuro Re-ed Details  Tandem balance 4X 30 seconds each and single-leg stance 4X 10 seconds each      Exercises   Exercises Knee/Hip      Knee/Hip Exercises: Aerobic   Recumbent Bike Seat 2 for 8 minutes at level 2      Knee/Hip Exercises: Machines for Strengthening   Cybex Leg Press Double leg 75# and R  knee 37# 15X each focus on full extension (avoid hyperextension) and slow eccentrics      Knee/Hip Exercises: Seated   Other Seated Knee/Hip Exercises Tailgate knee flexion 1 minute      Knee/Hip Exercises: Supine   Quad Sets Strengthening;Both;2 sets;10 reps;Limitations                     PT Education - 09/04/21 0937     Education Details Reviewed the importance of quadriceps strength for safely getting rid of the crutch and improving function.    Person(s) Educated Patient    Methods Explanation;Demonstration;Tactile cues;Verbal cues    Comprehension Verbalized understanding;Tactile cues required;Need further instruction;Returned demonstration;Verbal cues required              PT Short Term Goals - 09/02/21 0904       PT SHORT TERM GOAL #1   Title Improve Rt knee AROM to 0-110 degrees.    Time 4    Period Weeks    Status On-going    Target Date 09/04/21      PT SHORT TERM GOAL #2   Title Improve Rt quadriceps strength to 35 pounds     Time 4    Period Weeks    Status On-going    Target Date 09/04/21               PT Long Term Goals - 09/02/21 0905       PT LONG TERM GOAL #1   Title Improve FOTO to 55.    Time 8    Period Weeks    Status On-going    Target Date 10/02/21      PT LONG TERM GOAL #2   Title Improve Rt knee pain to consistently 0-3/10 on the Numeric Pain Rating Scale.    Time 8    Period Weeks    Status On-going    Target Date 10/02/21      PT LONG TERM GOAL #3   Title Improve Rt knee flexion AROM to 120 degrees.    Time 8    Period Weeks    Status Achieved    Target Date 10/02/21      PT LONG TERM GOAL #4   Title Improve Rt quadriceps strength to 64 pounds (age/body weight specific norms).    Time 8    Period Weeks    Status On-going    Target Date 10/02/21      PT LONG TERM GOAL #5   Title Cheryl Baker will be independent with her long-term HEP at [place].    Baseline Started today    Time 8    Period Weeks    Status On-going    Target Date 10/02/21                   Plan - 09/04/21 [id number]     Clinical Impression Statement Cheryl Baker recognizes improvements in her standing and walking comfort, endurance and function since starting PT.  She still uses a single crutch and doesn't trust her R knee with descending stairs and other more advanced functional activities.  We spent a lot of time on balance and function today and reinforced the importance of quadriceps strength to meet long-term goals.    Personal Factors and Comorbidities Comorbidity 3+    Comorbidities HTN, HLD, hx Lt achilles tendon repair    Examination-Activity Limitations Stairs;Stand;Bed Mobility;Bend;Sit;Transfers;Sleep;Carry;Locomotion Level;Squat    Examination-Participation Restrictions Interpersonal Relationship;Occupation;Cleaning;Community Activity;Driving    Stability/Clinical Decision Making Stable/Uncomplicated  Rehab Potential Good    PT Frequency Other (comment)   1-2X/week   PT Duration 8 weeks     PT Treatment/Interventions ADLs/Self Care Home Management;Electrical Stimulation;Cryotherapy;Gait training;Stair training;Therapeutic activities;Neuromuscular re-education;Therapeutic exercise;Balance training;Patient/family education;Manual techniques;Vasopneumatic Device;Passive range of motion    PT Next Visit Plan Quadriceps strength, slow eccentric with stairs, sit to stand, functional progressions    PT Home Exercise Plan Access Code: [email or web address]    Consulted and Agree with Plan of Care Patient             Patient will benefit from skilled therapeutic intervention in order to improve the following deficits and impairments:  Abnormal gait, Decreased activity tolerance, Decreased endurance, Decreased range of motion, Difficulty walking, Decreased strength, Increased edema, Impaired flexibility, Impaired perceived functional ability, Pain  Visit Diagnosis: Difficulty in walking, not elsewhere classified  Localized edema  Muscle weakness (generalized)  Stiffness of right knee, not elsewhere classified  Acute pain of right knee     Problem List Patient Active Problem List   Diagnosis Date Noted   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

## 2021-09-04 NOTE — Telephone Encounter (Signed)
Faxed office visit note from yesterday and a return to work note with a date of 09/21/2021 to [phone] claim # [id number]

## 2021-09-09 ENCOUNTER — Encounter: Payer: Self-pay | Admitting: Rehabilitative and Restorative Service Providers"

## 2021-09-09 ENCOUNTER — Ambulatory Visit: Payer: BC Managed Care – PPO | Admitting: Rehabilitative and Restorative Service Providers"

## 2021-09-09 ENCOUNTER — Other Ambulatory Visit: Payer: Self-pay

## 2021-09-09 DIAGNOSIS — M6281 Muscle weakness (generalized): Secondary | ICD-10-CM

## 2021-09-09 DIAGNOSIS — M25561 Pain in right knee: Secondary | ICD-10-CM

## 2021-09-09 DIAGNOSIS — R262 Difficulty in walking, not elsewhere classified: Secondary | ICD-10-CM

## 2021-09-09 DIAGNOSIS — R600 Localized edema: Secondary | ICD-10-CM | POA: Diagnosis not present

## 2021-09-09 DIAGNOSIS — M25661 Stiffness of right knee, not elsewhere classified: Secondary | ICD-10-CM

## 2021-09-09 NOTE — Patient Instructions (Addendum)
°  Access Code: [email or web address] URL: [email or web address] Date: 09/09/2021 Prepared by: Pauletta Browns  Exercises Supine Quadricep Sets - 3-5 x daily - 7 x weekly - 2-3 sets - 10 reps - 5 second hold Seated Knee Flexion AAROM - 3-5 x daily - 7 x weekly - 1 sets - 1 reps - 3 minutes hold Small Range Straight Leg Raise - 3 x daily - 7 x weekly - 3-5 sets - 5 reps - 3 seconds hold Tandem Stance - 2 x daily - 7 x weekly - 1 sets - 4 reps - 20 second hold

## 2021-09-09 NOTE — Therapy (Signed)
[facility] [place], [place], [address] Phone: [phone]   Fax:  [phone]  Physical Therapy Treatment  Patient Details  Name: Cheryl Baker MRN: [id number] Date of Birth: 07/06/1956 Referring Provider (PT): Newt Minion MD   Encounter Date: 09/09/2021   PT End of Session - 09/09/21 1436     Visit Number 10    Number of Visits 14    Date for PT Re-Evaluation 10/02/21    Progress Note Due on Visit 14    PT Start Time 1432    Activity Tolerance Patient tolerated treatment well;No increased pain    Behavior During Therapy WFL for tasks assessed/performed             Past Medical History:  Diagnosis Date   GERD (gastroesophageal reflux disease)    Hyperlipidemia    Hypertension    MMT (medial meniscus tear)    right   Pre-diabetes     Past Surgical History:  Procedure Laterality Date   ABDOMINAL HYSTERECTOMY     ACHILLES TENDON REPAIR Left    ANKLE SURGERY     KNEE ARTHROSCOPY WITH MEDIAL MENISECTOMY Right 06/30/2021   Procedure: RIGHT KNEE ARTHROSCOPY AND DEBRIDEMENT of medial meniscus tear;  Surgeon: Newt Minion, MD;  Location: [facility];  Service: Orthopedics;  Laterality: Right;    There were no vitals filed for this visit.   Subjective Assessment - 09/09/21 1437     Subjective Cheryl Baker has not used her crutch since Monday.  She has taken 1 oxycodone since her last visit, usually using Tylenol.    Pertinent History HTN, HLD, Lt achilles tendon repair    Limitations Sitting;Walking;Lifting;House hold activities;Standing    How long can you sit comfortably? 30 minutes    How long can you stand comfortably? 30-35 minutes    How long can you walk comfortably? 10-15 minutes    Patient Stated Goals Feel more comfortable without the crutch, stairs easier, less pain    Currently in Pain? Yes    Pain Score 2     Pain Location Knee    Pain Orientation Right    Pain Descriptors /  Indicators Tightness    Pain Type Surgical pain    Pain Radiating Towards NA    Pain Onset More than a month ago    Pain Frequency Intermittent    Aggravating Factors  Prolonged WB or prolonged postures    Pain Relieving Factors Exercises    Effect of Pain on Daily Activities Limited WB endurance    Multiple Pain Sites No                               [facility] Adult PT Treatment/Exercise - 09/09/21 0001       Therapeutic Activites    Therapeutic Activities ADL's    ADL's Step-up and over 4 inch step slow eccentrics      Neuro Re-ed    Neuro Re-ed Details  Tandem balance 4X 30 seconds each and single-leg stance 4X 10 seconds each      Exercises   Exercises Knee/Hip      Knee/Hip Exercises: Aerobic   Recumbent Bike Seat 2 for 8 minutes at level 2      Knee/Hip Exercises: Machines for Strengthening   Cybex Leg Press Double leg 75# and R knee 37# 15X each focus on full extension (avoid hyperextension) and slow eccentrics  Knee/Hip Exercises: Seated   Other Seated Knee/Hip Exercises --    Other Seated Knee/Hip Exercises Seated straight leg raises 3 sets of 5 for 3 seconds (slow eccentrics)      Knee/Hip Exercises: Supine   Quad Sets Strengthening;Both;1 set;10 reps;Limitations                     PT Education - 09/09/21 1521     Education Details Added 2 new activities to her HEP with emphasis on quadriceps strength.    Person(s) Educated Patient    Methods Explanation;Demonstration;Tactile cues;Verbal cues;Handout    Comprehension Tactile cues required;Verbalized understanding;Returned demonstration;Need further instruction;Verbal cues required              PT Short Term Goals - 09/02/21 0904       PT SHORT TERM GOAL #1   Title Improve Rt knee AROM to 0-110 degrees.    Time 4    Period Weeks    Status On-going    Target Date 09/04/21      PT SHORT TERM GOAL #2   Title Improve Rt quadriceps strength to 35 pounds    Time 4     Period Weeks    Status On-going    Target Date 09/04/21               PT Long Term Goals - 09/02/21 0905       PT LONG TERM GOAL #1   Title Improve FOTO to 55.    Time 8    Period Weeks    Status On-going    Target Date 10/02/21      PT LONG TERM GOAL #2   Title Improve Rt knee pain to consistently 0-3/10 on the Numeric Pain Rating Scale.    Time 8    Period Weeks    Status On-going    Target Date 10/02/21      PT LONG TERM GOAL #3   Title Improve Rt knee flexion AROM to 120 degrees.    Time 8    Period Weeks    Status Achieved    Target Date 10/02/21      PT LONG TERM GOAL #4   Title Improve Rt quadriceps strength to 64 pounds (age/body weight specific norms).    Time 8    Period Weeks    Status On-going    Target Date 10/02/21      PT LONG TERM GOAL #5   Title Cheryl Baker will be independent with her long-term HEP at DC.    Baseline Started today    Time 8    Period Weeks    Status On-going    Target Date 10/02/21                   Plan - 09/09/21 1522     Clinical Impression Statement Cheryl Baker attended PT today without her crutch for the first time.  Quadriceps strength is improving although she still needs work on single leg strength and balance for activities like stairs, curbs and comfort on uneven surfaces.  Continue current POC with progress note next visit to meet LTGs.    Personal Factors and Comorbidities Comorbidity 3+    Comorbidities HTN, HLD, hx Lt achilles tendon repair    Examination-Activity Limitations Stairs;Stand;Bed Mobility;Bend;Sit;Transfers;Sleep;Carry;Locomotion Level;Squat    Examination-Participation Restrictions Interpersonal Relationship;Occupation;Cleaning;Community Activity;Driving    Stability/Clinical Decision Making Stable/Uncomplicated    Rehab Potential Good    PT Frequency Other (comment)   1-2X/week  PT Duration 8 weeks    PT Treatment/Interventions ADLs/Self Care Home Management;Electrical  Stimulation;Cryotherapy;Gait training;Stair training;Therapeutic activities;Neuromuscular re-education;Therapeutic exercise;Balance training;Patient/family education;Manual techniques;Vasopneumatic Device;Passive range of motion    PT Next Visit Plan Quadriceps strength, slow eccentric with stairs, sit to stand, functional progressions.  Progress note and FOTO.    PT Home Exercise Plan Access Code: [email or web address]    Consulted and Agree with Plan of Care Patient             Patient will benefit from skilled therapeutic intervention in order to improve the following deficits and impairments:  Abnormal gait, Decreased activity tolerance, Decreased endurance, Decreased range of motion, Difficulty walking, Decreased strength, Increased edema, Impaired flexibility, Impaired perceived functional ability, Pain  Visit Diagnosis: Difficulty in walking, not elsewhere classified  Localized edema  Muscle weakness (generalized)  Stiffness of right knee, not elsewhere classified  Acute pain of right knee     Problem List Patient Active Problem List   Diagnosis Date Noted   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

## 2021-09-11 ENCOUNTER — Other Ambulatory Visit: Payer: Self-pay

## 2021-09-11 ENCOUNTER — Ambulatory Visit: Payer: BC Managed Care – PPO | Admitting: Rehabilitative and Restorative Service Providers"

## 2021-09-11 ENCOUNTER — Encounter: Payer: Self-pay | Admitting: Rehabilitative and Restorative Service Providers"

## 2021-09-11 DIAGNOSIS — R262 Difficulty in walking, not elsewhere classified: Secondary | ICD-10-CM | POA: Diagnosis not present

## 2021-09-11 DIAGNOSIS — M25661 Stiffness of right knee, not elsewhere classified: Secondary | ICD-10-CM

## 2021-09-11 DIAGNOSIS — R600 Localized edema: Secondary | ICD-10-CM | POA: Diagnosis not present

## 2021-09-11 DIAGNOSIS — M25561 Pain in right knee: Secondary | ICD-10-CM

## 2021-09-11 DIAGNOSIS — M6281 Muscle weakness (generalized): Secondary | ICD-10-CM | POA: Diagnosis not present

## 2021-09-11 NOTE — Therapy (Signed)
[facility] [address] [place], [place], [address] Phone: [phone]   Fax:  [phone]  Physical Therapy Treatment/Progress Note  Patient Details  Name: Cheryl Baker MRN: [id number] Date of Birth: Jan 20, 1956 Referring Provider (PT): Newt Minion MD  Progress Note Reporting Period 08/07/2021 to 09/11/2021  See note below for Objective Data and Assessment of Progress/Goals.     Encounter Date: 09/11/2021   PT End of Session - 09/11/21 1623     Visit Number 11    Number of Visits 19    Date for PT Re-Evaluation 10/02/21    Progress Note Due on Visit 19    PT Start Time 1351    PT Stop Time 1442    PT Time Calculation (min) 51 min    Activity Tolerance Patient tolerated treatment well;No increased pain    Behavior During Therapy WFL for tasks assessed/performed             Past Medical History:  Diagnosis Date   GERD (gastroesophageal reflux disease)    Hyperlipidemia    Hypertension    MMT (medial meniscus tear)    right   Pre-diabetes     Past Surgical History:  Procedure Laterality Date   ABDOMINAL HYSTERECTOMY     ACHILLES TENDON REPAIR Left    ANKLE SURGERY     KNEE ARTHROSCOPY WITH MEDIAL MENISECTOMY Right 06/30/2021   Procedure: RIGHT KNEE ARTHROSCOPY AND DEBRIDEMENT of medial meniscus tear;  Surgeon: Newt Minion, MD;  Location: [facility];  Service: Orthopedics;  Laterality: Right;    There were no vitals filed for this visit.   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  Edema and increased pain are noted.    Pertinent History HTN, HLD, Lt achilles tendon repair    Limitations Sitting;Walking;Lifting;House hold activities;Standing    How long can you sit comfortably? 30 minutes    How long can you stand comfortably? 45-60 minutes    How long can you walk comfortably? 15-20  minutes    Patient Stated Goals Feel more comfortable without the crutch, stairs easier, less pain    Currently in Pain? Yes    Pain Score 4     Pain Location Knee    Pain Orientation Right    Pain Descriptors / Indicators Sore    Pain Type Surgical pain    Pain Radiating Towards NA    Pain Onset More than a month ago    Pain Frequency Intermittent    Aggravating Factors  Prolonged postures or WB    Pain Relieving Factors Exercises, ice, tylenol and change of position    Effect of Pain on Daily Activities Limited WB endurance    Multiple Pain Sites No                [facility] PT Assessment - 09/11/21 0001       Observation/Other Assessments   Focus on Therapeutic Outcomes (FOTO)  50 (Goal 55, was 29)      ROM / Strength   AROM / PROM / Strength AROM;Strength      AROM   Overall AROM  Deficits    AROM Assessment Site Knee    Right/Left Knee Left;Right    Right Knee Extension 0    Right Knee Flexion 120    Left Knee Extension 0    Left Knee Flexion 131  Strength   Overall Strength Deficits    Right/Left Knee Left;Right    Right Knee Extension --   40.7 pounds   Left Knee Extension --   50.9 pounds                          [facility] Adult PT Treatment/Exercise - 09/11/21 0001       Neuro Re-ed    Neuro Re-ed Details  Tandem balance 4X 30 seconds each and single-leg stance 4X 10 seconds each      Exercises   Exercises Knee/Hip      Knee/Hip Exercises: Aerobic   Recumbent Bike Seat 2 for 8 minutes at level 2      Knee/Hip Exercises: Machines for Strengthening   Cybex Leg Press Double leg 75# and R knee 37# 15X each focus on full extension (avoid hyperextension) and slow eccentrics      Knee/Hip Exercises: Seated   Other Seated Knee/Hip Exercises Seated straight leg raises 3 sets of 5 for 3 seconds (slow eccentrics)      Knee/Hip Exercises: Supine   Quad Sets Strengthening;Both;1 set;10 reps;Limitations      Modalities   Modalities  Vasopneumatic      Vasopneumatic   Number Minutes Vasopneumatic  10 minutes    Vasopnuematic Location  Knee    Vasopneumatic Pressure Medium    Vasopneumatic Temperature  34*                     PT Education - 09/11/21 1625     Education Details Reviewed reassessment findings and focus on quadriceps strengthening with HEP.  Encouraged ice when on her feet all day to help with pain and swelling.    Person(s) Educated Patient    Methods Explanation;Demonstration;Tactile cues;Verbal cues    Comprehension Verbalized understanding;Tactile cues required;Need further instruction;Returned demonstration;Verbal cues required              PT Short Term Goals - 09/11/21 1626       PT SHORT TERM GOAL #1   Title Improve Rt knee AROM to 0-110 degrees.    Baseline 0-120    Time 4    Period Weeks    Status Achieved    Target Date 09/04/21      PT SHORT TERM GOAL #2   Title Improve Rt quadriceps strength to 35 pounds    Baseline 40 pounds    Time 4    Period Weeks    Status Achieved    Target Date 09/04/21               PT Long Term Goals - 09/11/21 1627       PT LONG TERM GOAL #1   Title Improve FOTO to 55.    Baseline 50 (was 29)    Time 4    Period Weeks    Status On-going    Target Date 10/09/21      PT LONG TERM GOAL #2   Title Improve Rt knee pain to consistently 0-3/10 on the Numeric Pain Rating Scale.    Baseline Can be 4-5/10 with prolonged WB.    Time 4    Period Weeks    Status On-going    Target Date 10/09/21      PT LONG TERM GOAL #3   Title Improve Rt knee flexion AROM to 120 degrees.    Baseline Met    Time 8    Period Weeks  Status Achieved    Target Date 10/02/21      PT LONG TERM GOAL #4   Title Improve Rt quadriceps strength to 64 pounds (age/body weight specific norms).    Baseline 40 (was 21)    Time 4    Period Weeks    Status On-going    Target Date 10/09/21      PT LONG TERM GOAL #5   Title Cheryl Baker will be  independent with her long-term HEP at [place].    Baseline Updated today    Time 4    Period Weeks    Status On-going    Target Date 10/09/21                   Plan - 09/11/21 1628     Clinical Impression Statement Cheryl Baker notes progress with her R knee pain and function since starting physical therapy.  She is off her crutches and has nearly doubled her objective quadriceps strength.  She still has a limp, can't stand or walk too long and is shy of long term quadriceps strength goals.  She will benefit from another 2-4 weeks of supervised PT to advance strength to acceptable levels, clean-up gait and prepare her for more independent rehabilitation.    Personal Factors and Comorbidities Comorbidity 3+    Comorbidities HTN, HLD, hx Lt achilles tendon repair    Examination-Activity Limitations Stairs;Stand;Bed Mobility;Bend;Sit;Transfers;Sleep;Carry;Locomotion Level;Squat    Examination-Participation Restrictions Interpersonal Relationship;Occupation;Cleaning;Community Activity;Driving    Stability/Clinical Decision Making Stable/Uncomplicated    Rehab Potential Good    PT Frequency 2x / week   1-2X/week   PT Duration 4 weeks    PT Treatment/Interventions ADLs/Self Care Home Management;Electrical Stimulation;Cryotherapy;Gait training;Stair training;Therapeutic activities;Neuromuscular re-education;Therapeutic exercise;Balance training;Patient/family education;Manual techniques;Vasopneumatic Device;Passive range of motion    PT Next Visit Plan Quadriceps strength, slow eccentric with stairs, sit to stand, functional progressions.  Edema control with vaso.    PT Home Exercise Plan Access Code: [email or web address]    Consulted and Agree with Plan of Care Patient             Patient will benefit from skilled therapeutic intervention in order to improve the following deficits and impairments:  Abnormal gait, Decreased activity tolerance, Decreased endurance, Decreased range of motion, Difficulty  walking, Decreased strength, Increased edema, Impaired flexibility, Impaired perceived functional ability, Pain  Visit Diagnosis: Difficulty in walking, not elsewhere classified  Localized edema  Muscle weakness (generalized)  Stiffness of right knee, not elsewhere classified  Acute pain of right knee     Problem List Patient Active Problem List   Diagnosis Date Noted   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

## 2021-09-11 NOTE — Patient Instructions (Signed)
[email or web address]

## 2021-09-16 ENCOUNTER — Encounter: Payer: Self-pay | Admitting: Rehabilitative and Restorative Service Providers"

## 2021-09-16 ENCOUNTER — Ambulatory Visit: Payer: BC Managed Care – PPO | Admitting: Rehabilitative and Restorative Service Providers"

## 2021-09-16 ENCOUNTER — Other Ambulatory Visit: Payer: Self-pay

## 2021-09-16 DIAGNOSIS — M6281 Muscle weakness (generalized): Secondary | ICD-10-CM | POA: Diagnosis not present

## 2021-09-16 DIAGNOSIS — Z23 Encounter for immunization: Secondary | ICD-10-CM | POA: Diagnosis not present

## 2021-09-16 DIAGNOSIS — R262 Difficulty in walking, not elsewhere classified: Secondary | ICD-10-CM | POA: Diagnosis not present

## 2021-09-16 DIAGNOSIS — E782 Mixed hyperlipidemia: Secondary | ICD-10-CM | POA: Diagnosis not present

## 2021-09-16 DIAGNOSIS — M25561 Pain in right knee: Secondary | ICD-10-CM

## 2021-09-16 DIAGNOSIS — M25661 Stiffness of right knee, not elsewhere classified: Secondary | ICD-10-CM

## 2021-09-16 DIAGNOSIS — I10 Essential (primary) hypertension: Secondary | ICD-10-CM | POA: Diagnosis not present

## 2021-09-16 DIAGNOSIS — E1169 Type 2 diabetes mellitus with other specified complication: Secondary | ICD-10-CM | POA: Diagnosis not present

## 2021-09-16 DIAGNOSIS — R600 Localized edema: Secondary | ICD-10-CM

## 2021-09-16 DIAGNOSIS — Z0000 Encounter for general adult medical examination without abnormal findings: Secondary | ICD-10-CM | POA: Diagnosis not present

## 2021-09-16 NOTE — Therapy (Signed)
[facility] [place], [place], [address] Phone: [phone]   Fax:  [phone]  Physical Therapy Treatment  Patient Details  Name: Cheryl Baker MRN: [id number] Date of Birth: Nov 24, 1955 Referring Provider (PT): Cheryl Minion MD   Encounter Date: 09/16/2021   PT End of Session - 09/16/21 1356     Visit Number 12    Number of Visits 19    Date for PT Re-Evaluation 10/02/21    Progress Note Due on Visit 19    PT Start Time 1354    PT Stop Time 1444    PT Time Calculation (min) 50 min    Activity Tolerance Patient tolerated treatment well;No increased pain    Behavior During Therapy WFL for tasks assessed/performed             Past Medical History:  Diagnosis Date   GERD (gastroesophageal reflux disease)    Hyperlipidemia    Hypertension    MMT (medial meniscus tear)    right   Pre-diabetes     Past Surgical History:  Procedure Laterality Date   ABDOMINAL HYSTERECTOMY     ACHILLES TENDON REPAIR Left    ANKLE SURGERY     KNEE ARTHROSCOPY WITH MEDIAL MENISECTOMY Right 06/30/2021   Procedure: RIGHT KNEE ARTHROSCOPY AND DEBRIDEMENT of medial meniscus tear;  Surgeon: Cheryl Minion, MD;  Location: [facility];  Service: Orthopedics;  Laterality: Right;    There were no vitals filed for this visit.   Subjective Assessment - 09/16/21 1356     Subjective Patient indicated no real pain today, just some general soreness.  Pt. indicated she has been doing better and indicated 2-3/10 at most since last visit.    Pertinent History HTN, HLD, Lt achilles tendon repair    Limitations Sitting;Walking;Lifting;House hold activities;Standing    Patient Stated Goals Feel more comfortable without the crutch, stairs easier, less pain    Currently in Pain? No/denies    Pain Score 0-No pain    Pain Onset More than a month ago                               [facility] Adult PT Treatment/Exercise -  09/16/21 0001       Neuro Re-ed    Neuro Re-ed Details  tandem ambulation on foam fwd/back 8 ft in // bars x5 each way, tandem stance on foam 1 min x 1 bilateral      Knee/Hip Exercises: Aerobic   Recumbent Bike Seat 3 5 mins lvl 3      Knee/Hip Exercises: Machines for Strengthening   Cybex Knee Extension Full range (no pain noted) Double leg up, Rt leg eccentric 2 x 10 10 lbs    Cybex Leg Press Double leg 81 lbs 2 x 15, single leg 37 lbs 2 x 15 Rt      Knee/Hip Exercises: Standing   Other Standing Knee Exercises step up and over 4 inch step x 10 bilateral      Knee/Hip Exercises: Seated   Other Seated Knee/Hip Exercises seated SLR Rt 2 x 10      Vasopneumatic   Number Minutes Vasopneumatic  10 minutes    Vasopnuematic Location  Knee    Vasopneumatic Pressure Medium    Vasopneumatic Temperature  34*                       PT  Short Term Goals - 09/11/21 1626       PT SHORT TERM GOAL #1   Title Improve Rt knee AROM to 0-110 degrees.    Baseline 0-120    Time 4    Period Weeks    Status Achieved    Target Date 09/04/21      PT SHORT TERM GOAL #2   Title Improve Rt quadriceps strength to 35 pounds    Baseline 40 pounds    Time 4    Period Weeks    Status Achieved    Target Date 09/04/21               PT Long Term Goals - 09/11/21 1627       PT LONG TERM GOAL #1   Title Improve FOTO to 55.    Baseline 50 (was 29)    Time 4    Period Weeks    Status On-going    Target Date 10/09/21      PT LONG TERM GOAL #2   Title Improve Rt knee pain to consistently 0-3/10 on the Numeric Pain Rating Scale.    Baseline Can be 4-5/10 with prolonged WB.    Time 4    Period Weeks    Status On-going    Target Date 10/09/21      PT LONG TERM GOAL #3   Title Improve Rt knee flexion AROM to 120 degrees.    Baseline Met    Time 8    Period Weeks    Status Achieved    Target Date 10/02/21      PT LONG TERM GOAL #4   Title Improve Rt quadriceps strength to  64 pounds (age/body weight specific norms).    Baseline 40 (was 21)    Time 4    Period Weeks    Status On-going    Target Date 10/09/21      PT LONG TERM GOAL #5   Title Cheryl Baker will be independent with her long-term HEP at [place].    Baseline Updated today    Time 4    Period Weeks    Status On-going    Target Date 10/09/21                   Plan - 09/16/21 1357     Clinical Impression Statement Patient demonstrated improved quality in gait and speed in gait today as compared to previous.  Pt. to return to office work next week, cues given to promote consistent movement to prevent stiffness.  Difficulty c Rt leg loaded step up/down continued.  Pt. seen by Cheryl Baker PT for last portion of visit due to Pt. arrival late for appointment (last 15 mins).    Personal Factors and Comorbidities Comorbidity 3+    Comorbidities HTN, HLD, hx Lt achilles tendon repair    Examination-Activity Limitations Stairs;Stand;Bed Mobility;Bend;Sit;Transfers;Sleep;Carry;Locomotion Level;Squat    Examination-Participation Restrictions Interpersonal Relationship;Occupation;Cleaning;Community Activity;Driving    Stability/Clinical Decision Making Stable/Uncomplicated    Rehab Potential Good    PT Frequency 2x / week   1-2X/week   PT Duration 4 weeks    PT Treatment/Interventions ADLs/Self Care Home Management;Electrical Stimulation;Cryotherapy;Gait training;Stair training;Therapeutic activities;Neuromuscular re-education;Therapeutic exercise;Balance training;Patient/family education;Manual techniques;Vasopneumatic Device;Passive range of motion    PT Next Visit Plan Continue to progress strength and dynamic/compliant surface balance.    PT Home Exercise Plan Access Code: [email or web address]    Consulted and Agree with Plan of Care Patient  Patient will benefit from skilled therapeutic intervention in order to improve the following deficits and impairments:  Abnormal gait, Decreased activity  tolerance, Decreased endurance, Decreased range of motion, Difficulty walking, Decreased strength, Increased edema, Impaired flexibility, Impaired perceived functional ability, Pain  Visit Diagnosis: Difficulty in walking, not elsewhere classified  Localized edema  Muscle weakness (generalized)  Stiffness of right knee, not elsewhere classified  Acute pain of right knee     Problem List Patient Active Problem List   Diagnosis Date Noted   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

## 2021-09-18 ENCOUNTER — Encounter: Payer: Self-pay | Admitting: Rehabilitative and Restorative Service Providers"

## 2021-09-18 ENCOUNTER — Ambulatory Visit: Payer: BC Managed Care – PPO | Admitting: Rehabilitative and Restorative Service Providers"

## 2021-09-18 ENCOUNTER — Other Ambulatory Visit: Payer: Self-pay

## 2021-09-18 DIAGNOSIS — R600 Localized edema: Secondary | ICD-10-CM

## 2021-09-18 DIAGNOSIS — M6281 Muscle weakness (generalized): Secondary | ICD-10-CM | POA: Diagnosis not present

## 2021-09-18 DIAGNOSIS — M25661 Stiffness of right knee, not elsewhere classified: Secondary | ICD-10-CM | POA: Diagnosis not present

## 2021-09-18 DIAGNOSIS — R262 Difficulty in walking, not elsewhere classified: Secondary | ICD-10-CM

## 2021-09-18 DIAGNOSIS — M25561 Pain in right knee: Secondary | ICD-10-CM

## 2021-09-18 NOTE — Patient Instructions (Signed)
Continue 50 quad sets and 30-50 seated SLR per day.

## 2021-09-18 NOTE — Therapy (Signed)
Cheryl Baker [place], [place], [address] Phone: [phone]   Fax:  [phone]  Physical Therapy Treatment  Patient Details  Name: Cheryl Baker MRN: [id number] Date of Birth: 1956-07-23 Referring Provider (PT): Newt Minion MD   Encounter Date: 09/18/2021   PT End of Session - 09/18/21 1351     Visit Number 13    Number of Visits 19    Date for PT Re-Evaluation 10/02/21    Progress Note Due on Visit 19    PT Start Time 1540    PT Stop Time 1427    PT Time Calculation (min) 40 min    Activity Tolerance Patient tolerated treatment well;No increased pain    Behavior During Therapy WFL for tasks assessed/performed             Past Medical History:  Diagnosis Date   GERD (gastroesophageal reflux disease)    Hyperlipidemia    Hypertension    MMT (medial meniscus tear)    right   Pre-diabetes     Past Surgical History:  Procedure Laterality Date   ABDOMINAL HYSTERECTOMY     ACHILLES TENDON REPAIR Left    ANKLE SURGERY     KNEE ARTHROSCOPY WITH MEDIAL MENISECTOMY Right 06/30/2021   Procedure: RIGHT KNEE ARTHROSCOPY AND DEBRIDEMENT of medial meniscus tear;  Surgeon: Newt Minion, MD;  Location: [facility];  Service: Orthopedics;  Laterality: Right;    There were no vitals filed for this visit.   Subjective Assessment - 09/18/21 1351     Subjective Cheryl Baker notes some stiffness and edema.  Her worst knee pain was a "3-4/10" on the VAS.  "Things are going much better."  Tylenol PRN.    Pertinent History HTN, HLD, Lt achilles tendon repair    Limitations Sitting;Walking;Lifting;House hold activities;Standing    How long can you sit comfortably? 30 minutes    How long can you stand comfortably? 45-60 minutes    How long can you walk comfortably? 15-20 minutes    Patient Stated Goals Feel more comfortable without the crutch, stairs easier, less pain    Currently in Pain? Yes    Pain Score 1      Pain Location Knee    Pain Orientation Right    Pain Descriptors / Indicators Sore    Pain Type Surgical pain    Pain Radiating Towards NA    Pain Onset More than a month ago    Pain Frequency Intermittent    Aggravating Factors  Prolonged postures or WB    Pain Relieving Factors Exercises, ice, tylenol and change of position    Effect of Pain on Daily Activities Limited WB endurance    Multiple Pain Sites No                               [facility] Adult PT Treatment/Exercise - 09/18/21 0001       Therapeutic Activites    Therapeutic Activities ADL's    ADL's Step-up and over 4 inch step slow eccentrics (land on heel, use hand rail PRN to keep strain on thigh, not knee)      Neuro Re-ed    Neuro Re-ed Details  Tandem balance: eyes open; head turning; eyes closed 2X 20 seconds each and tandem walk on foam 4 laps      Exercises   Exercises Knee/Hip      Knee/Hip Exercises: Aerobic  Recumbent Bike Seat 2 for 8 minutes at level 2      Knee/Hip Exercises: Machines for Strengthening   Cybex Leg Press Double leg 50# and R knee 25# 15X each focus on full extension (avoid hyperextension) and slow eccentrics (did less weights today to avoid popping)                     PT Education - 09/18/21 1356     Education Details Reviewed HEP with emphasis on quadriceps strength.  Progressed functional activities (balance, stairs) in pain-free range.    Person(s) Educated Patient    Methods Explanation;Demonstration;Verbal cues    Comprehension Need further instruction;Verbal cues required;Returned demonstration;Verbalized understanding              PT Short Term Goals - 09/11/21 1626       PT SHORT TERM GOAL #1   Title Improve Rt knee AROM to 0-110 degrees.    Baseline 0-120    Time 4    Period Weeks    Status Achieved    Target Date 09/04/21      PT SHORT TERM GOAL #2   Title Improve Rt quadriceps strength to 35 pounds    Baseline 40 pounds     Time 4    Period Weeks    Status Achieved    Target Date 09/04/21               PT Long Term Goals - 09/11/21 1627       PT LONG TERM GOAL #1   Title Improve FOTO to 55.    Baseline 50 (was 29)    Time 4    Period Weeks    Status On-going    Target Date 10/09/21      PT LONG TERM GOAL #2   Title Improve Rt knee pain to consistently 0-3/10 on the Numeric Pain Rating Scale.    Baseline Can be 4-5/10 with prolonged WB.    Time 4    Period Weeks    Status On-going    Target Date 10/09/21      PT LONG TERM GOAL #3   Title Improve Rt knee flexion AROM to 120 degrees.    Baseline Met    Time 8    Period Weeks    Status Achieved    Target Date 10/02/21      PT LONG TERM GOAL #4   Title Improve Rt quadriceps strength to 64 pounds (age/body weight specific norms).    Baseline 40 (was 21)    Time 4    Period Weeks    Status On-going    Target Date 10/09/21      PT LONG TERM GOAL #5   Title Emerlyn will be independent with her long-term HEP at DC.    Baseline Updated today    Time 4    Period Weeks    Status On-going    Target Date 10/09/21                   Plan - 09/18/21 1426     Clinical Impression Statement Cheryl Baker notes less pain and better function since starting PT.  She is more sore today as she has been on her feet a lot this week helping with a family function.  We reviewed her HEP with appropriate progressions and discussed the importance of continued quadriceps strength work to meet all long-term goals.    Personal Factors and Comorbidities Comorbidity 3+  Comorbidities HTN, HLD, hx Lt achilles tendon repair    Examination-Activity Limitations Stairs;Stand;Bed Mobility;Bend;Sit;Transfers;Sleep;Carry;Locomotion Level;Squat    Examination-Participation Restrictions Interpersonal Relationship;Occupation;Cleaning;Community Activity;Driving    Stability/Clinical Decision Making Stable/Uncomplicated    Rehab Potential Good    PT Frequency 2x /  week   1-2X/week   PT Duration 4 weeks    PT Treatment/Interventions ADLs/Self Care Home Management;Electrical Stimulation;Cryotherapy;Gait training;Stair training;Therapeutic activities;Neuromuscular re-education;Therapeutic exercise;Balance training;Patient/family education;Manual techniques;Vasopneumatic Device;Passive range of motion    PT Next Visit Plan Continue to progress strength, control edema (vaso) and dynamic/compliant surface balance.    PT Home Exercise Plan Access Code: [email or web address]    Consulted and Agree with Plan of Care Patient             Patient will benefit from skilled therapeutic intervention in order to improve the following deficits and impairments:  Abnormal gait, Decreased activity tolerance, Decreased endurance, Decreased range of motion, Difficulty walking, Decreased strength, Increased edema, Impaired flexibility, Impaired perceived functional ability, Pain  Visit Diagnosis: Difficulty in walking, not elsewhere classified  Localized edema  Muscle weakness (generalized)  Stiffness of right knee, not elsewhere classified  Acute pain of right knee     Problem List Patient Active Problem List   Diagnosis Date Noted   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 [place], [place], [address] Phone: [phone]   Fax:  [phone]  Name: ARIONNE IAMS MRN: [id number] Date of Birth: Oct 05, 1955

## 2021-09-23 ENCOUNTER — Encounter: Payer: Self-pay | Admitting: Physical Therapy

## 2021-09-23 ENCOUNTER — Ambulatory Visit: Payer: BC Managed Care – PPO | Admitting: Physical Therapy

## 2021-09-23 ENCOUNTER — Other Ambulatory Visit: Payer: Self-pay

## 2021-09-23 DIAGNOSIS — R600 Localized edema: Secondary | ICD-10-CM

## 2021-09-23 DIAGNOSIS — M25661 Stiffness of right knee, not elsewhere classified: Secondary | ICD-10-CM | POA: Diagnosis not present

## 2021-09-23 DIAGNOSIS — M25561 Pain in right knee: Secondary | ICD-10-CM

## 2021-09-23 DIAGNOSIS — M6281 Muscle weakness (generalized): Secondary | ICD-10-CM | POA: Diagnosis not present

## 2021-09-23 DIAGNOSIS — R262 Difficulty in walking, not elsewhere classified: Secondary | ICD-10-CM | POA: Diagnosis not present

## 2021-09-23 NOTE — Therapy (Signed)
[place], [place], [address] Phone: [phone]   Fax:  [phone]  Physical Therapy Treatment  Patient Details  Name: Cheryl Baker MRN: [id number] Date of Birth: 1955/11/08 Referring Provider (PT): Meridee Score, MD   Encounter Date: 09/23/2021   PT End of Session - 09/23/21 0810     Visit Number 14    Number of Visits 19    Date for PT Re-Evaluation 10/02/21    Progress Note Due on Visit 19    PT Start Time 0804    PT Stop Time 1941    PT Time Calculation (min) 40 min    Activity Tolerance Patient tolerated treatment well;No increased pain    Behavior During Therapy WFL for tasks assessed/performed             Past Medical History:  Diagnosis Date   GERD (gastroesophageal reflux disease)    Hyperlipidemia    Hypertension    MMT (medial meniscus tear)    right   Pre-diabetes     Past Surgical History:  Procedure Laterality Date   ABDOMINAL HYSTERECTOMY     ACHILLES TENDON REPAIR Left    ANKLE SURGERY     KNEE ARTHROSCOPY WITH MEDIAL MENISECTOMY Right 06/30/2021   Procedure: RIGHT KNEE ARTHROSCOPY AND DEBRIDEMENT of medial meniscus tear;  Surgeon: Newt Minion, MD;  Location: [facility];  Service: Orthopedics;  Laterality: Right;    There were no vitals filed for this visit.   Subjective Assessment - 09/23/21 0813     Subjective Pt arriving today reporting LOB last night in her home. Pt stated she did not hit the floor. Pt reported she didn't sleep well last night and had to take prescription pain med and still wasn't able to sleep.    Pertinent History HTN, HLD, Lt achilles tendon repair    Limitations Sitting;Walking;Lifting;House hold activities;Standing    How long can you sit comfortably? 30 minutes    How long can you stand comfortably? 45-60 minutes    How long can you walk comfortably? 15-20 minutes    Patient Stated Goals Feel more comfortable without the crutch,  stairs easier, less pain    Currently in Pain? Yes    Pain Score 3     Pain Orientation Right;Anterior    Pain Descriptors / Indicators Sore    Pain Type Surgical pain    Pain Onset More than a month ago                [facility] PT Assessment - 09/23/21 0001       Assessment   Medical Diagnosis s/p right knee arthroscopy    Referring Provider (PT) Meridee Score, MD    Onset Date/Surgical Date 06/30/21      AROM   Right Knee Extension 0    Right Knee Flexion 120    Left Knee Extension 0    Left Knee Flexion 130      Strength   Overall Strength Deficits    Right Knee Flexion 5/5    Right Knee Extension 4+/5                           [facility] Adult PT Treatment/Exercise - 09/23/21 0001       Neuro Re-ed    Neuro Re-ed Details  Ariex beam x 4 with no UE support, lateral step ups on 4 inch step working on eccentric control, vectors toward  3 cones on sliding disc using Rt LE as stance leg x 10, side stepping x 20 feet each direction      Knee/Hip Exercises: Stretches   Other Knee/Hip Stretches standing lunge stretch for gastroc/soleus and hip flexor x 2 holding 30 seconds      Knee/Hip Exercises: Aerobic   Recumbent Bike Seat 2 for 8 minutes at level 4      Knee/Hip Exercises: Machines for Strengthening   Cybex Knee Extension 5# lifting with bilateral LE's and lowering with Rt LE only x 15    Cybex Knee Flexion Rt LE only: 10# x 15    Cybex Leg Press 75# bilateral LE's 3x10      Knee/Hip Exercises: Seated   Sit to Sand 10 reps;without UE support                       PT Short Term Goals - 09/11/21 1626       PT SHORT TERM GOAL #1   Title Improve Rt knee AROM to 0-110 degrees.    Baseline 0-120    Time 4    Period Weeks    Status Achieved    Target Date 09/04/21      PT SHORT TERM GOAL #2   Title Improve Rt quadriceps strength to 35 pounds    Baseline 40 pounds    Time 4    Period Weeks    Status Achieved    Target Date 09/04/21                PT Long Term Goals - 09/11/21 1627       PT LONG TERM GOAL #1   Title Improve FOTO to 55.    Baseline 50 (was 29)    Time 4    Period Weeks    Status On-going    Target Date 10/09/21      PT LONG TERM GOAL #2   Title Improve Rt knee pain to consistently 0-3/10 on the Numeric Pain Rating Scale.    Baseline Can be 4-5/10 with prolonged WB.    Time 4    Period Weeks    Status On-going    Target Date 10/09/21      PT LONG TERM GOAL #3   Title Improve Rt knee flexion AROM to 120 degrees.    Baseline Met    Time 8    Period Weeks    Status Achieved    Target Date 10/02/21      PT LONG TERM GOAL #4   Title Improve Rt quadriceps strength to 64 pounds (age/body weight specific norms).    Baseline 40 (was 21)    Time 4    Period Weeks    Status On-going    Target Date 10/09/21      PT LONG TERM GOAL #5   Title Cheryl Baker will be independent with her long-term HEP at DC.    Baseline Updated today    Time 4    Period Weeks    Status On-going    Target Date 10/09/21                   Plan - 09/23/21 0816     Clinical Impression Statement Pt with increased pain today due to loss of balance last night in her home where she feels she may have twisted her right knee. Pt tolerating exercises well progressing with emphasis on quad strengthening of her right LE for improved  functional mobility. Conintue skilled PT.    Personal Factors and Comorbidities Comorbidity 3+    Comorbidities HTN, HLD, hx Lt achilles tendon repair    Examination-Activity Limitations Stairs;Stand;Bed Mobility;Bend;Sit;Transfers;Sleep;Carry;Locomotion Level;Squat    Examination-Participation Restrictions Interpersonal Relationship;Occupation;Cleaning;Community Activity;Driving    Stability/Clinical Decision Making Stable/Uncomplicated    Rehab Potential Good    PT Frequency 2x / week   1-2X/week   PT Duration 4 weeks    PT Treatment/Interventions ADLs/Self Care Home  Management;Electrical Stimulation;Cryotherapy;Gait training;Stair training;Therapeutic activities;Neuromuscular re-education;Therapeutic exercise;Balance training;Patient/family education;Manual techniques;Vasopneumatic Device;Passive range of motion    PT Next Visit Plan Continue to progress strength, control edema (vaso) as needed, and dynamic/compliant surface balance.    PT Home Exercise Plan Access Code: [email or web address]    Consulted and Agree with Plan of Care Patient             Patient will benefit from skilled therapeutic intervention in order to improve the following deficits and impairments:  Abnormal gait, Decreased activity tolerance, Decreased endurance, Decreased range of motion, Difficulty walking, Decreased strength, Increased edema, Impaired flexibility, Impaired perceived functional ability, Pain  Visit Diagnosis: Difficulty in walking, not elsewhere classified  Localized edema  Muscle weakness (generalized)  Stiffness of right knee, not elsewhere classified  Acute pain of right knee     Problem List Patient Active Problem List   Diagnosis Date Noted   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

## 2021-10-01 ENCOUNTER — Ambulatory Visit (INDEPENDENT_AMBULATORY_CARE_PROVIDER_SITE_OTHER): Payer: BC Managed Care – PPO | Admitting: Orthopedic Surgery

## 2021-10-01 ENCOUNTER — Other Ambulatory Visit: Payer: Self-pay

## 2021-10-01 DIAGNOSIS — M23321 Other meniscus derangements, posterior horn of medial meniscus, right knee: Secondary | ICD-10-CM

## 2021-10-04 ENCOUNTER — Encounter: Payer: Self-pay | Admitting: Orthopedic Surgery

## 2021-10-04 NOTE — Progress Notes (Signed)
° °  Office Visit Note   Patient: Cheryl Baker           Date of Birth: 03-25-1956           MRN: [id number] Visit Date: 10/01/2021              Requested by: Mayra Neer, MD [address] [place],  [place] [address] PCP: Mayra Neer, MD  Chief Complaint  Patient presents with   Right Knee - Follow-up    06/30/21 right knee scope and deb      HPI: Patient is 3 months status post right knee arthroscopy and debridement she returned to work on February 6 she has no complaints she states she still has swelling.  Assessment & Plan: Visit Diagnoses:  1. Other meniscus derangements, posterior horn of medial meniscus, right knee     Plan: Continue to increase activities as tolerated continue with strengthening.  Follow-Up Instructions: Return if symptoms worsen or fail to improve.   Ortho Exam  Patient is alert, oriented, no adenopathy, well-dressed, normal affect, normal respiratory effort. Examination patient has a mild effusion there is no pain with passive range of motion.  Imaging: No results found. No images are attached to the encounter.  Labs: No results found for: HGBA1C, ESRSEDRATE, CRP, LABURIC, REPTSTATUS, GRAMSTAIN, CULT, LABORGA   Lab Results  Component Value Date   ALBUMIN 4.0 04/29/2008    No results found for: MG No results found for: VD25OH  No results found for: PREALBUMIN CBC EXTENDED 04/29/2008  WBC 5.4  RBC 5.10  HGB 15.3(H)  HCT 44.6  PLT 220  NEUTROABS 2.8  LYMPHSABS 1.9     There is no height or weight on file to calculate BMI.  Orders:  No orders of the defined types were placed in this encounter.  No orders of the defined types were placed in this encounter.    Procedures: No procedures performed  Clinical Data: No additional findings.  ROS:  All other systems negative, except as noted in the HPI. Review of Systems  Objective: Vital Signs: There were no vitals taken for this  visit.  Specialty Comments:  No specialty comments available.  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 No pertinent family history.  Past Surgical History:  Procedure Laterality Date   ABDOMINAL HYSTERECTOMY     ACHILLES TENDON REPAIR Left    ANKLE SURGERY     KNEE ARTHROSCOPY WITH MEDIAL MENISECTOMY Right 06/30/2021   Procedure: RIGHT KNEE ARTHROSCOPY AND DEBRIDEMENT of medial meniscus tear;  Surgeon: Newt Minion, MD;  Location: [facility];  Service: Orthopedics;  Laterality: Right;   Social History   Occupational History   Not on file  Tobacco Use   Smoking status: Never   Smokeless tobacco: Never  Substance and Sexual Activity   Alcohol use: No   Drug use: No   Sexual activity: Not on file

## 2021-10-05 DIAGNOSIS — L219 Seborrheic dermatitis, unspecified: Secondary | ICD-10-CM | POA: Diagnosis not present

## 2021-10-05 DIAGNOSIS — L658 Other specified nonscarring hair loss: Secondary | ICD-10-CM | POA: Diagnosis not present

## 2021-10-05 DIAGNOSIS — L669 Cicatricial alopecia, unspecified: Secondary | ICD-10-CM | POA: Diagnosis not present

## 2021-10-22 DIAGNOSIS — R220 Localized swelling, mass and lump, head: Secondary | ICD-10-CM | POA: Diagnosis not present

## 2021-10-22 DIAGNOSIS — R809 Proteinuria, unspecified: Secondary | ICD-10-CM | POA: Diagnosis not present

## 2021-10-22 DIAGNOSIS — E1169 Type 2 diabetes mellitus with other specified complication: Secondary | ICD-10-CM | POA: Diagnosis not present

## 2021-10-22 DIAGNOSIS — I10 Essential (primary) hypertension: Secondary | ICD-10-CM | POA: Diagnosis not present

## 2021-11-11 ENCOUNTER — Telehealth: Payer: Self-pay | Admitting: Orthopedic Surgery

## 2021-11-11 NOTE — Telephone Encounter (Signed)
Pt is in a lot of pain right now. She is barley able to walk. She wants a call  ? ?CB [phone]  ?

## 2021-11-11 NOTE — Telephone Encounter (Signed)
Please call pt and make an appt

## 2021-11-12 ENCOUNTER — Telehealth: Payer: Self-pay | Admitting: Orthopaedic Surgery

## 2021-11-12 ENCOUNTER — Telehealth: Payer: Self-pay

## 2021-11-12 ENCOUNTER — Telehealth: Payer: Self-pay | Admitting: Orthopedic Surgery

## 2021-11-12 NOTE — Telephone Encounter (Signed)
Pt called and states that she needs to be seen asap. She states that she has not left the bed in 2 weeks. ? ?CB [phone]  ?

## 2021-11-12 NOTE — Telephone Encounter (Signed)
I called pt and she will come tomorrow for a knee brace demo nurse only visit.  ?

## 2021-11-12 NOTE — Telephone Encounter (Signed)
Patient called. Says she does not need an appointment. Just need someone to call her and tell her how to put the brace on. Her call back number is [phone] ?

## 2021-11-12 NOTE — Telephone Encounter (Signed)
Please call pt to schedule an appt to be seen. ?

## 2021-11-12 NOTE — Telephone Encounter (Signed)
Yes that would be great thank you! 

## 2021-11-12 NOTE — Telephone Encounter (Signed)
Patient called stating that she had a rt knee brace from Dr. Prince Rome. She would like to come into the office to show her how to put the brace on she stated that she can not remember and she just needs that stabilization.  ? ?Do you want to make this a nurse visit ?  ?Please advise  ?

## 2021-11-12 NOTE — Telephone Encounter (Signed)
Duda pt ?

## 2021-11-13 ENCOUNTER — Ambulatory Visit: Payer: BC Managed Care – PPO

## 2021-11-17 ENCOUNTER — Telehealth: Payer: Self-pay | Admitting: Orthopedic Surgery

## 2021-11-17 NOTE — Telephone Encounter (Signed)
Patient is asking if there are any supplements or vitamins or anything other than pain meds that she can take to help with her joint pain and symptoms. Please advise! ?

## 2021-11-19 NOTE — Telephone Encounter (Signed)
Can you please call this pt? She has called multiple times over the past two weeks with c/o knee pain. It is best that she come in for eval being that she had surgery on this knee in November and it should not be causing her continued problems. Happy to see her any time. Thanks! ?

## 2021-11-23 ENCOUNTER — Telehealth: Payer: Self-pay | Admitting: Orthopedic Surgery

## 2021-11-23 NOTE — Telephone Encounter (Signed)
LMOM for pt to return call to sch for a follow up visit with Lajoyce Corners per message about pt continuing to have knee pain ?

## 2021-11-24 NOTE — Telephone Encounter (Signed)
Cheryl Baker has appt scheduled for 4/14 with Sharol Given. ?

## 2021-11-27 ENCOUNTER — Ambulatory Visit (INDEPENDENT_AMBULATORY_CARE_PROVIDER_SITE_OTHER): Payer: BC Managed Care – PPO

## 2021-11-27 ENCOUNTER — Ambulatory Visit (INDEPENDENT_AMBULATORY_CARE_PROVIDER_SITE_OTHER): Payer: BC Managed Care – PPO | Admitting: Family

## 2021-11-27 DIAGNOSIS — G8929 Other chronic pain: Secondary | ICD-10-CM

## 2021-11-27 DIAGNOSIS — M25561 Pain in right knee: Secondary | ICD-10-CM

## 2021-11-27 MED ORDER — DICLOFENAC SODIUM 1 % EX GEL: 2.0000 g | Freq: Four times a day (QID) | CUTANEOUS | 2 refills | Status: AC | PRN

## 2021-11-27 NOTE — Progress Notes (Signed)
? ?Office Visit Note ?  ?Patient: Cheryl Baker           ?Date of Birth: 08-22-1955           ?MRN: [id number] ?Visit Date: 11/27/2021 ?             ?Requested by: Lupita Raider, MD ?[address] ?[address] ?[place],  [place] [address] ?PCP: Lupita Raider, MD ? ?Chief Complaint  ?Patient presents with  ? Right Knee - Pain  ? ? ? ? ?HPI: ?The patient is a 66 year old woman who presents today in follow-up for right knee pain.  She would like to discuss the pathology of her knee pain and what her options are moving forward. ? ?She has known osteoarthritis and did have recent arthroscopy with debridement for meniscal tear.  Since her surgery she has not gotten the relief she was hoping for she has been using topical treatment as well as Tylenol as needed for pain ? ?Assessment & Plan: ?Visit Diagnoses:  ?1. Chronic pain of right knee   ? ? ?Plan: At this time the patient would like to hold off on any injections of her knees.  She may be open to supplemental injections in the future.  Prior to any future appointment she may reach out to [place] requesting prior authorization for supplemental injection for her right knee.  At this point she would like to proceed with using topical creams as well as anti-inflammatories by mouth. ? ?Her x-rays and MRI as well as typical course of osteoarthritis was discussed at length.  Questions were encouraged and answered. ? ?Follow-Up Instructions: No follow-ups on file.  ? ?Right Knee Exam  ? ?Tenderness  ?The patient is experiencing tenderness in the medial joint line. ? ?Range of Motion  ?The patient has normal right knee ROM. ? ?Tests  ?Varus: negative Valgus: negative ? ?Other  ?Erythema: absent ?Swelling: mild ?Effusion: effusion present ? ? ? ? ?Patient is alert, oriented, no adenopathy, well-dressed, normal affect, normal respiratory effort. ? ? ?Imaging: ?No results found. ?No images are attached to the encounter. ? ?Labs: ?No results found for: HGBA1C, ESRSEDRATE, CRP,  LABURIC, REPTSTATUS, GRAMSTAIN, CULT, LABORGA ? ? ?Lab Results  ?Component Value Date  ? ALBUMIN 4.0 04/29/2008  ? ? ?No results found for: MG ?No results found for: VD25OH ? ?No results found for: PREALBUMIN ? ?  04/29/2008  ?  7:52 AM  ?CBC EXTENDED  ?WBC 5.4    ?RBC 5.10    ?Hemoglobin 15.3    ?HCT 44.6    ?Platelets 220    ?NEUT# 2.8    ?Lymph# 1.9    ? ? ? ?There is no height or weight on file to calculate BMI. ? ?Orders:  ?Orders Placed This Encounter  ?Procedures  ? XR Knee 1-2 Views Right  ? ?Meds ordered this encounter  ?Medications  ? diclofenac Sodium (VOLTAREN) 1 % GEL  ?  Sig: Apply 2 g topically 4 (four) times daily as needed.  ?  Dispense:  150 g  ?  Refill:  2  ? ? ? Procedures: ?No procedures performed ? ?Clinical Data: ?No additional findings. ? ?ROS: ? ?All other systems negative, except as noted in the HPI. ?Review of Systems ? ?Objective: ?Vital Signs: There were no vitals taken for this visit. ? ?Specialty Comments:  ?No specialty comments available. ? ?PMFS History: ?Patient Active Problem List  ? Diagnosis Date Noted  ? 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 ?Past Medical History:  ?Diagnosis Date  ? GERD (gastroesophageal reflux disease)   ? Hyperlipidemia   ? Hypertension   ? MMT (medial meniscus tear)   ? right  ? Pre-diabetes   ?  ?No family history on file.  ?Past Surgical History:  ?Procedure Laterality Date  ? ABDOMINAL HYSTERECTOMY    ? ACHILLES TENDON REPAIR Left   ? ANKLE SURGERY    ? KNEE ARTHROSCOPY WITH MEDIAL MENISECTOMY Right 06/30/2021  ? Procedure: RIGHT KNEE ARTHROSCOPY AND DEBRIDEMENT of medial meniscus tear;  Surgeon: Nadara Mustard, MD;  Location: [facility];  Service: Orthopedics;  Laterality: Right;  ? ?Social History  ? ?Occupational History  ? Not on file  ?Tobacco Use  ? Smoking status: Never  ? Smokeless tobacco: Never  ?Substance and Sexual Activity  ? Alcohol use: No  ? Drug use: No   ? Sexual activity: Not on file  ? ? ? ? ? ?

## 2021-12-03 ENCOUNTER — Other Ambulatory Visit: Payer: Self-pay | Admitting: Family Medicine

## 2021-12-03 DIAGNOSIS — Z1231 Encounter for screening mammogram for malignant neoplasm of breast: Secondary | ICD-10-CM

## 2021-12-28 ENCOUNTER — Ambulatory Visit
Admission: RE | Admit: 2021-12-28 | Discharge: 2021-12-28 | Disposition: A | Discharge status: Home / Self Care | Payer: BC Managed Care – PPO | Source: Ambulatory Visit | Attending: Family Medicine | Admitting: Family Medicine

## 2021-12-28 ENCOUNTER — Telehealth: Payer: Self-pay | Admitting: Orthopedic Surgery

## 2021-12-28 DIAGNOSIS — Z1231 Encounter for screening mammogram for malignant neoplasm of breast: Secondary | ICD-10-CM

## 2021-12-28 NOTE — Telephone Encounter (Signed)
S/p right knee scope and deb of medial meniscus tear. No prior hx of cortisone shots. Okay to start approval on gel injections? ?

## 2021-12-28 NOTE — Telephone Encounter (Signed)
Pt has medical questions about gel injections, how many administrated, and is she a candidate she is interested in. Want to know if applied with xrays. Please call pt at [phone]. ?

## 2021-12-29 ENCOUNTER — Telehealth: Payer: Self-pay | Admitting: Orthopedic Surgery

## 2021-12-29 NOTE — Telephone Encounter (Signed)
Pt called requesting a call back. Pt has medical questions about gel injections before we submit to insurance company for approval. Please call pt at [phone] ?

## 2021-12-29 NOTE — Telephone Encounter (Signed)
Noted  

## 2021-12-29 NOTE — Telephone Encounter (Signed)
Please send in approval request for gel injection on right knee. Thank you. ?

## 2021-12-29 NOTE — Telephone Encounter (Signed)
See previous note. I will call her, I haven't had the chance to call her back yet. ?

## 2022-02-09 DIAGNOSIS — H40013 Open angle with borderline findings, low risk, bilateral: Secondary | ICD-10-CM | POA: Diagnosis not present

## 2022-02-10 DIAGNOSIS — Z23 Encounter for immunization: Secondary | ICD-10-CM | POA: Diagnosis not present

## 2022-02-24 DIAGNOSIS — M25562 Pain in left knee: Secondary | ICD-10-CM | POA: Diagnosis not present

## 2022-02-24 DIAGNOSIS — M1711 Unilateral primary osteoarthritis, right knee: Secondary | ICD-10-CM | POA: Diagnosis not present

## 2022-03-17 DIAGNOSIS — R809 Proteinuria, unspecified: Secondary | ICD-10-CM | POA: Diagnosis not present

## 2022-03-17 DIAGNOSIS — E669 Obesity, unspecified: Secondary | ICD-10-CM | POA: Diagnosis not present

## 2022-03-17 DIAGNOSIS — E1169 Type 2 diabetes mellitus with other specified complication: Secondary | ICD-10-CM | POA: Diagnosis not present

## 2022-03-17 DIAGNOSIS — E782 Mixed hyperlipidemia: Secondary | ICD-10-CM | POA: Diagnosis not present

## 2022-03-17 DIAGNOSIS — I10 Essential (primary) hypertension: Secondary | ICD-10-CM | POA: Diagnosis not present

## 2022-03-24 DIAGNOSIS — L600 Ingrowing nail: Secondary | ICD-10-CM | POA: Diagnosis not present

## 2022-04-07 DIAGNOSIS — L600 Ingrowing nail: Secondary | ICD-10-CM | POA: Diagnosis not present

## 2022-04-07 DIAGNOSIS — B351 Tinea unguium: Secondary | ICD-10-CM | POA: Diagnosis not present

## 2022-04-07 DIAGNOSIS — M79672 Pain in left foot: Secondary | ICD-10-CM | POA: Diagnosis not present

## 2022-04-12 DIAGNOSIS — L669 Cicatricial alopecia, unspecified: Secondary | ICD-10-CM | POA: Diagnosis not present

## 2022-04-12 DIAGNOSIS — L089 Local infection of the skin and subcutaneous tissue, unspecified: Secondary | ICD-10-CM | POA: Diagnosis not present

## 2022-06-09 DIAGNOSIS — Z23 Encounter for immunization: Secondary | ICD-10-CM | POA: Diagnosis not present

## 2022-07-05 DIAGNOSIS — R413 Other amnesia: Secondary | ICD-10-CM | POA: Diagnosis not present

## 2022-07-05 DIAGNOSIS — F411 Generalized anxiety disorder: Secondary | ICD-10-CM | POA: Diagnosis not present

## 2022-07-06 ENCOUNTER — Encounter: Payer: Self-pay | Admitting: Physician Assistant

## 2022-07-14 ENCOUNTER — Other Ambulatory Visit (INDEPENDENT_AMBULATORY_CARE_PROVIDER_SITE_OTHER): Payer: BC Managed Care – PPO

## 2022-07-14 ENCOUNTER — Ambulatory Visit (INDEPENDENT_AMBULATORY_CARE_PROVIDER_SITE_OTHER): Payer: BC Managed Care – PPO | Admitting: Physician Assistant

## 2022-07-14 ENCOUNTER — Encounter: Payer: Self-pay | Admitting: Physician Assistant

## 2022-07-14 VITALS — BP 163/99 | HR 69 | Resp 18 | Ht 62.0 in | Wt 160.0 lb

## 2022-07-14 DIAGNOSIS — R413 Other amnesia: Secondary | ICD-10-CM

## 2022-07-14 LAB — VITAMIN B12: Vitamin B-12: 866 pg/mL (ref 211–911)

## 2022-07-14 LAB — TSH: TSH: 1.96 u[IU]/mL (ref 0.35–5.50)

## 2022-07-14 NOTE — Progress Notes (Incomplete Revision)
Assessment/Plan:   Cheryl Baker is a very pleasant 66 y.o. year old RH female with  a history of hypertension, hyperlipidemia, prediabetes, gout, chronic knee pain seen today for evaluation of memory loss.  MoCA today is 23/30. There is possible a component of depression due to grief that may contribute to her memory difficulties. Workup is progress.      Mild Cognitive Impairment  MRI brain without contrast to assess for underlying structural abnormality and assess vascular load  Neurocognitive testing to further evaluate cognitive concerns and determine other underlying cause of memory changes, including potential contribution from sleep, anxiety, or depression  Check B12, TSH Discussed the importance of regular daily schedule with inclusion of crossword puzzles to maintain brain  Folllow up in 1 month to discuss the MRI results Recommend Psychotherapy for depression. Continue to control mood as per PCP. She reports being on antidepressant for the last 2 weeks but it is not listed.    Subjective:   The patient is accompanied by her husband who supplements the history.   How long did patient have memory difficulties?" After mom died in 04-24-20 and dad passed 02-09-2023and also I was having major medical issues I noticed some changes". "Can't remember some things, trouble focusing and concentrating". Gotten worse over the last 6 months, having some difficulty in conversations and names. She is not very active with other intellectual activities since their death, "I just don't want to do anything" repeats oneself? Endorsed Disoriented when walking into a room?  Patient denies   Leaving objects in unusual places?  Patient denies   Ambulates  with difficulty?   Patient denies   Recent falls?  Patient denies   Any head injuries?  Patient denies   History of seizures?   Patient denies   Wandering behavior?  Patient denies   Patient drives? " No issues", denies getting lost.   Any  mood changes such irritability agitation?  History of depression for several years, Just started antidepressant for the last 2 weeks Any history of depression?:  Patient denies a prior diagnosis but she admits dealing with situational depression as above  Hallucinations?  Endorsed, " for the last year  I see a person but when I look no one is there, or sometimes I get kind of scared " I see my mom, she doesn't talk to me"  Paranoia?  Denies   Patient reports that  sleeps well without vivid dreams, REM behavior or sleepwalking . She goes to sleep at 6:30  and is up at 4:30 in am and then is tired though the day.  History of sleep apnea?  Patient denies   Any hygiene concerns?  Patient denies   Independent of bathing and dressing?  Endorsed  Does the patient needs help with medications? Patient in charge, occasionally he misses doses   Who is in charge of the finances?  Patient is in charge  Any changes in appetite?  Not as good as before     Patient have trouble swallowing? Patient denies   Does the patient cook?  Patient denies   Any kitchen accidents such as leaving the stove on? Patient denies   Any headaches?  When BP up  The double vision? Patient denies   Any focal numbness or tingling?  Patient denies   Chronic back pain Patient denies   Unilateral weakness?  Patient denies   Any tremors?  Patient denies   Any history of anosmia?  Patient  denies   Any incontinence of urine?  Patient denies   Any bowel dysfunction?   Patient denies      History of heavy alcohol intake?  Patient denies   History of heavy tobacco use?  Patient denies   Family history of dementia? Mother, MGM, had dementia but was untreated and Father had Alzheimer's disease "an was treated" Patient lives with husband   Retired Tourist information centre manager, 12th grade   Past Medical History:  Diagnosis Date   GERD (gastroesophageal reflux disease)    Hyperlipidemia    Hypertension    MMT (medial meniscus tear)    right    Pre-diabetes      Past Surgical History:  Procedure Laterality Date   ABDOMINAL HYSTERECTOMY     ACHILLES TENDON REPAIR Left    ANKLE SURGERY     KNEE ARTHROSCOPY WITH MEDIAL MENISECTOMY Right 06/30/2021   Procedure: RIGHT KNEE ARTHROSCOPY AND DEBRIDEMENT of medial meniscus tear;  Surgeon: Nadara Mustard, MD;  Location: [facility];  Service: Orthopedics;  Laterality: Right;     Allergies  Allergen Reactions   Aspirin     High doses of aspirin irritate her stomach ulcer   Penicillins    Prednisone Nausea Only    Current Outpatient Medications  Medication Instructions   amLODipine (NORVASC) 5 mg, Oral, Daily   Ascorbic Acid (VITAMIN C PO) 1 tablet, Oral, Daily   aspirin EC 81 mg, Oral, Daily   atorvastatin (LIPITOR) 40 mg, Oral, Daily   Calcium Carbonate-Vitamin D 600-200 MG-UNIT TABS Oral, Daily   Colchicine (MITIGARE) 0.6 MG CAPS 1 capsule, Oral, 2 times daily PRN   finasteride (PROSCAR) 5 MG tablet Oral   ibuprofen (ADVIL) 200 mg, Oral, Every 8 hours PRN   Multiple Vitamin (MULTI-VITAMIN) tablet Oral   Multiple Vitamins-Minerals (HAIR SKIN AND NAILS FORMULA PO) Oral   oxyCODONE-acetaminophen (PERCOCET/ROXICET) 5-325 MG tablet 1 tablet, Oral, Every 4 hours PRN   potassium chloride SA (KLOR-CON) 20 MEQ tablet 20 mEq, Oral, As needed     VITALS:   Vitals:   07/14/22 1317  BP: (!) 163/99  Pulse: 69  Resp: 18  SpO2: 98%  Weight: 160 lb (72.6 kg)  Height: 5\' 2"  (1.575 m)       No data to display          PHYSICAL EXAM   HEENT:  Normocephalic, atraumatic. The mucous membranes are moist. The superficial temporal arteries are without ropiness or tenderness. Cardiovascular: Regular rate and rhythm. Lungs: Clear to auscultation bilaterally. Neck: There are no carotid bruits noted bilaterally.  NEUROLOGICAL:     No data to display              No data to display           Orientation:  Alert and oriented to person, place and time. Flat  affect . No aphasia or dysarthria. Fund of knowledge is appropriate. Recent memory mildly impaired and remote memory intact.  Attention and concentration are normal.  Able to name objects and repeat phrases. Delayed recall 4/5   Cranial nerves: There is good facial symmetry. Extraocular muscles are intact and visual fields are full to confrontational testing. Speech is fluent and clear. Soft palate rises symmetrically and there is no tongue deviation. Hearing is intact to conversational tone. Tone: Tone is good throughout. Sensation: Sensation is intact to light touch and pinprick throughout. Vibration is intact at the bilateral big toe.There is no extinction with double simultaneous stimulation. There is  no sensory dermatomal level identified. Coordination: The patient has no difficulty with RAM's or FNF bilaterally. Normal finger to nose  Motor: Strength is 5/5 in the bilateral upper and lower extremities. There is no pronator drift. There are no fasciculations noted. DTR's: Deep tendon reflexes are 2/4 at the bilateral biceps, triceps, brachioradialis, patella and achilles.  Plantar responses are downgoing bilaterally. Gait and Station: The patient is able to ambulate without difficulty.The patient is able to heel toe walk without any difficulty.The patient is able to ambulate in a tandem fashion. The patient is able to stand in the Romberg position.     Thank you for allowing [place] the opportunity to participate in the care of this nice patient. Please do not hesitate to contact us for any questions or concerns.   Total time spent on today's visit was 50 minutes dedicated to this patient today, preparing to see patient, examining the patient, ordering tests and/or medications and counseling the patient, documenting clinical information in the EHR or other health record, independently interpreting results and communicating results to the patient/family, discussing treatment and goals, answering  patient's questions and coordinating care.  Cc:  Lupita Raider, MD  Marlowe Kays 07/14/2022 7:56 PM

## 2022-07-14 NOTE — Patient Instructions (Addendum)
It was a pleasure to see you today at our office.   Recommendations:  Follow up in  6 weeks Check labs today Continue antidepressant Recommend psychotherapy for depression  Recommend good control of cardiovascular risk factors.    Neurocognitive testing   Whom to call:  Memory  decline, memory medications: Call our office [phone]   For psychiatric meds, mood meds: Please have your primary care physician manage these medications.    For assessment of decision of mental capacity and competency:  Call Dr. Erick Blinks, geriatric psychiatrist at [phone]    If you have any severe symptoms of a stroke, or other severe issues such as confusion,severe chills or fever, etc call 911 or go to the ER as you may need to be evaluated further      Feel free to go to the following database for funded clinical studies conducted around the world: RankChecks.se   https://www.triadclinicaltrials.com/     RECOMMENDATIONS FOR ALL PATIENTS WITH MEMORY PROBLEMS: 1. Continue to exercise (Recommend 30 minutes of walking everyday, or 3 hours every week) 2. Increase social interactions - continue going to [place] and enjoy social gatherings with friends and family 3. Eat healthy, avoid fried foods and eat more fruits and vegetables 4. Maintain adequate blood pressure, blood sugar, and blood cholesterol level. Reducing the risk of stroke and cardiovascular disease also helps promoting better memory. 5. Avoid stressful situations. Live a simple life and avoid aggravations. Organize your time and prepare for the next day in anticipation. 6. Sleep well, avoid any interruptions of sleep and avoid any distractions in the bedroom that may interfere with adequate sleep quality 7. Avoid sugar, avoid sweets as there is a strong link between excessive sugar intake, diabetes, and cognitive impairment We discussed the Mediterranean diet, which has been shown to help patients reduce the risk of  progressive memory disorders and reduces cardiovascular risk. This includes eating fish, eat fruits and green leafy vegetables, nuts like almonds and hazelnuts, walnuts, and also use olive oil. Avoid fast foods and fried foods as much as possible. Avoid sweets and sugar as sugar use has been linked to worsening of memory function.  There is always a concern of gradual progression of memory problems. If this is the case, then we may need to adjust level of care according to patient needs. Support, both to the patient and caregiver, should then be put into place.    FALL PRECAUTIONS: Be cautious when walking. Scan the area for obstacles that may increase the risk of trips and falls. When getting up in the mornings, sit up at the edge of the bed for a few minutes before getting out of bed. Consider elevating the bed at the head end to avoid drop of blood pressure when getting up. Walk always in a well-lit room (use night lights in the walls). Avoid area rugs or power cords from appliances in the middle of the walkways. Use a walker or a cane if necessary and consider physical therapy for balance exercise. Get your eyesight checked regularly.  FINANCIAL OVERSIGHT: Supervision, especially oversight when making financial decisions or transactions is also recommended.  HOME SAFETY: Consider the safety of the kitchen when operating appliances like stoves, microwave oven, and blender. Consider having supervision and share cooking responsibilities until no longer able to participate in those. Accidents with firearms and other hazards in the house should be identified and addressed as well.   ABILITY TO BE LEFT ALONE: If patient is unable to contact  911 operator, consider using LifeLine, or when the need is there, arrange for someone to stay with patients. Smoking is a fire hazard, consider supervision or cessation. Risk of wandering should be assessed by caregiver and if detected at any point, supervision and safe  proof recommendations should be instituted.  MEDICATION SUPERVISION: Inability to self-administer medication needs to be constantly addressed. Implement a mechanism to ensure safe administration of the medications.   DRIVING: Regarding driving, in patients with progressive memory problems, driving will be impaired. We advise to have someone else do the driving if trouble finding directions or if minor accidents are reported. Independent driving assessment is available to determine safety of driving.   If you are interested in the driving assessment, you can contact the following:  [employer] in [place] [phone]  [facility] [phone]  [facility] [phone] or [phone]  Your provider has requested that you have labwork completed today. Please go to [facility] ([id number]) on the second floor of this building before leaving the office today. You do not need to check in. If you are not called within 15 minutes please check with the front desk.   We have sent a referral to [facility] for your MRI and they will call you directly to schedule your appointment. They are located at [address] [facility]. If you need to contact them directly please call [phone].

## 2022-07-14 NOTE — Progress Notes (Addendum)
Assessment/Plan:   Cheryl Baker is a very pleasant 66 y.o. year old RH female with  a history of hypertension, hyperlipidemia, prediabetes, gout, chronic knee pain seen today for evaluation of memory loss.  MoCA today is 22/30. There is possible a component of depression due to grief that may contribute to her memory difficulties. Workup is progress.      Mild Cognitive Impairment  MRI brain without contrast to assess for underlying structural abnormality and assess vascular load  Neurocognitive testing to further evaluate cognitive concerns and determine other underlying cause of memory changes, including potential contribution from sleep, anxiety, or depression  Check B12, TSH Discussed the importance of regular daily schedule with inclusion of crossword puzzles to maintain brain  Folllow up in 1 month to discuss the MRI results Recommend Psychotherapy for depression. Continue to control mood as per PCP. She reports being on antidepressant for the last 2 weeks but it is not listed.    Subjective:   The patient is accompanied by her husband who supplements the history.   How long did patient have memory difficulties?" After mom died in 04-01-2020 and dad passed 2023-01-17and also I was having major medical issues I noticed some changes". "Can't remember some things, trouble focusing and concentrating". Gotten worse over the last 6 months, having some difficulty in conversations and names. She is not very active with other intellectual activities since their death, "I just don't want to do anything" repeats oneself? Endorsed Disoriented when walking into a room?  Patient denies   Leaving objects in unusual places?  Patient denies   Ambulates  with difficulty?   Patient denies   Recent falls?  Patient denies   Any head injuries?  Patient denies   History of seizures?   Patient denies   Wandering behavior?  Patient denies   Patient drives? " No issues", denies getting lost.   Any  mood changes such irritability agitation?  History of depression for several years, Just started antidepressant for the last 2 weeks Any history of depression?:  Patient denies a prior diagnosis but she admits dealing with situational depression as above  Hallucinations?  Endorsed, " for the last year  I see a person but when I look no one is there, or sometimes I get kind of scared " I see my mom, she doesn't talk to me"  Paranoia?  Denies   Patient reports that  sleeps well without vivid dreams, REM behavior or sleepwalking . She goes to sleep at 6:30  and is up at 4:30 in am and then is tired though the day.  History of sleep apnea?  Patient denies   Any hygiene concerns?  Patient denies   Independent of bathing and dressing?  Endorsed  Does the patient needs help with medications? Patient in charge, occasionally he misses doses   Who is in charge of the finances?  Patient is in charge  Any changes in appetite?  Not as good as before     Patient have trouble swallowing? Patient denies   Does the patient cook?  Patient denies   Any kitchen accidents such as leaving the stove on? Patient denies   Any headaches?  When BP up  The double vision? Patient denies   Any focal numbness or tingling?  Patient denies   Chronic back pain Patient denies   Unilateral weakness?  Patient denies   Any tremors?  Patient denies   Any history of anosmia?  Patient  denies   Any incontinence of urine?  Patient denies   Any bowel dysfunction?   Patient denies      History of heavy alcohol intake?  Patient denies   History of heavy tobacco use?  Patient denies   Family history of dementia? Mother, MGM, had dementia but was untreated and Father had Alzheimer's disease "an was treated" Patient lives with husband   Retired Tourist information centre manager, 12th grade   Past Medical History:  Diagnosis Date   GERD (gastroesophageal reflux disease)    Hyperlipidemia    Hypertension    MMT (medial meniscus tear)    right    Pre-diabetes      Past Surgical History:  Procedure Laterality Date   ABDOMINAL HYSTERECTOMY     ACHILLES TENDON REPAIR Left    ANKLE SURGERY     KNEE ARTHROSCOPY WITH MEDIAL MENISECTOMY Right 06/30/2021   Procedure: RIGHT KNEE ARTHROSCOPY AND DEBRIDEMENT of medial meniscus tear;  Surgeon: Nadara Mustard, MD;  Location: [facility];  Service: Orthopedics;  Laterality: Right;     Allergies  Allergen Reactions   Aspirin     High doses of aspirin irritate her stomach ulcer   Penicillins    Prednisone Nausea Only    Current Outpatient Medications  Medication Instructions   amLODipine (NORVASC) 5 mg, Oral, Daily   Ascorbic Acid (VITAMIN C PO) 1 tablet, Oral, Daily   aspirin EC 81 mg, Oral, Daily   atorvastatin (LIPITOR) 40 mg, Oral, Daily   Calcium Carbonate-Vitamin D 600-200 MG-UNIT TABS Oral, Daily   Colchicine (MITIGARE) 0.6 MG CAPS 1 capsule, Oral, 2 times daily PRN   finasteride (PROSCAR) 5 MG tablet Oral   ibuprofen (ADVIL) 200 mg, Oral, Every 8 hours PRN   Multiple Vitamin (MULTI-VITAMIN) tablet Oral   Multiple Vitamins-Minerals (HAIR SKIN AND NAILS FORMULA PO) Oral   oxyCODONE-acetaminophen (PERCOCET/ROXICET) 5-325 MG tablet 1 tablet, Oral, Every 4 hours PRN   potassium chloride SA (KLOR-CON) 20 MEQ tablet 20 mEq, Oral, As needed     VITALS:   Vitals:   07/14/22 1317  BP: (!) 163/99  Pulse: 69  Resp: 18  SpO2: 98%  Weight: 160 lb (72.6 kg)  Height: 5\' 2"  (1.575 m)       No data to display          PHYSICAL EXAM   HEENT:  Normocephalic, atraumatic. The mucous membranes are moist. The superficial temporal arteries are without ropiness or tenderness. Cardiovascular: Regular rate and rhythm. Lungs: Clear to auscultation bilaterally. Neck: There are no carotid bruits noted bilaterally.  NEUROLOGICAL:    07/20/2022   11:00 AM  Montreal Cognitive Assessment   Visuospatial/ Executive (0/5) 1  Naming (0/3) 3  Attention: Read list of digits  (0/2) 2  Attention: Read list of letters (0/1) 1  Attention: Serial 7 subtraction starting at 100 (0/3) 3  Language: Repeat phrase (0/2) 1  Language : Fluency (0/1) 1  Abstraction (0/2) 1  Delayed Recall (0/5) 4  Orientation (0/6) 4  Total 21  Adjusted Score (based on education) 22        No data to display           Orientation:  Alert and oriented to person, place and time. Flat affect . No aphasia or dysarthria. Fund of knowledge is appropriate. Recent memory mildly impaired and remote memory intact.  Attention and concentration are normal.  Able to name objects and repeat phrases. Delayed recall 4/5   Cranial nerves: There  is good facial symmetry. Extraocular muscles are intact and visual fields are full to confrontational testing. Speech is fluent and clear. Soft palate rises symmetrically and there is no tongue deviation. Hearing is intact to conversational tone. Tone: Tone is good throughout. Sensation: Sensation is intact to light touch and pinprick throughout. Vibration is intact at the bilateral big toe.There is no extinction with double simultaneous stimulation. There is no sensory dermatomal level identified. Coordination: The patient has no difficulty with RAM's or FNF bilaterally. Normal finger to nose  Motor: Strength is 5/5 in the bilateral upper and lower extremities. There is no pronator drift. There are no fasciculations noted. DTR's: Deep tendon reflexes are 2/4 at the bilateral biceps, triceps, brachioradialis, patella and achilles.  Plantar responses are downgoing bilaterally. Gait and Station: The patient is able to ambulate without difficulty.The patient is able to heel toe walk without any difficulty.The patient is able to ambulate in a tandem fashion. The patient is able to stand in the Romberg position.     Thank you for allowing [place] the opportunity to participate in the care of this nice patient. Please do not hesitate to contact us for any questions or  concerns.   Total time spent on today's visit was 50 minutes dedicated to this patient today, preparing to see patient, examining the patient, ordering tests and/or medications and counseling the patient, documenting clinical information in the EHR or other health record, independently interpreting results and communicating results to the patient/family, discussing treatment and goals, answering patient's questions and coordinating care.  Cc:  Lupita Raider, MD  Marlowe Kays 07/14/2022 7:56 PM

## 2022-07-14 NOTE — Progress Notes (Signed)
Blood tests are normal, thanks

## 2022-08-02 DIAGNOSIS — R413 Other amnesia: Secondary | ICD-10-CM | POA: Diagnosis not present

## 2022-08-02 DIAGNOSIS — F411 Generalized anxiety disorder: Secondary | ICD-10-CM | POA: Diagnosis not present

## 2022-08-04 ENCOUNTER — Encounter: Payer: Self-pay | Admitting: Physician Assistant

## 2022-08-04 DIAGNOSIS — Z09 Encounter for follow-up examination after completed treatment for conditions other than malignant neoplasm: Secondary | ICD-10-CM | POA: Diagnosis not present

## 2022-08-04 DIAGNOSIS — K649 Unspecified hemorrhoids: Secondary | ICD-10-CM | POA: Diagnosis not present

## 2022-08-04 DIAGNOSIS — D123 Benign neoplasm of transverse colon: Secondary | ICD-10-CM | POA: Diagnosis not present

## 2022-08-04 DIAGNOSIS — Z86010 Personal history of colonic polyps: Secondary | ICD-10-CM | POA: Diagnosis not present

## 2022-08-04 DIAGNOSIS — K5730 Diverticulosis of large intestine without perforation or abscess without bleeding: Secondary | ICD-10-CM | POA: Diagnosis not present

## 2022-08-05 ENCOUNTER — Ambulatory Visit
Admission: RE | Admit: 2022-08-05 | Discharge: 2022-08-05 | Disposition: A | Discharge status: Home / Self Care | Payer: BC Managed Care – PPO | Source: Ambulatory Visit | Attending: Physician Assistant | Admitting: Physician Assistant

## 2022-08-05 ENCOUNTER — Telehealth: Payer: Self-pay | Admitting: Physician Assistant

## 2022-08-05 DIAGNOSIS — G319 Degenerative disease of nervous system, unspecified: Secondary | ICD-10-CM | POA: Diagnosis not present

## 2022-08-05 DIAGNOSIS — J329 Chronic sinusitis, unspecified: Secondary | ICD-10-CM | POA: Diagnosis not present

## 2022-08-05 DIAGNOSIS — I619 Nontraumatic intracerebral hemorrhage, unspecified: Secondary | ICD-10-CM | POA: Diagnosis not present

## 2022-08-05 DIAGNOSIS — R413 Other amnesia: Secondary | ICD-10-CM | POA: Diagnosis not present

## 2022-08-05 NOTE — Telephone Encounter (Signed)
Pt called in and left a message. She is returning a call about her MRI results

## 2022-08-05 NOTE — Progress Notes (Signed)
MRI brain shows some mild atrophy and some chronic changes likely related to blood pressure history. Thanks

## 2022-08-11 DIAGNOSIS — H40013 Open angle with borderline findings, low risk, bilateral: Secondary | ICD-10-CM | POA: Diagnosis not present

## 2022-08-25 ENCOUNTER — Ambulatory Visit (INDEPENDENT_AMBULATORY_CARE_PROVIDER_SITE_OTHER): Payer: BC Managed Care – PPO | Admitting: Physician Assistant

## 2022-08-25 ENCOUNTER — Encounter: Payer: Self-pay | Admitting: Physician Assistant

## 2022-08-25 VITALS — BP 160/97 | HR 76 | Resp 20 | Ht 62.0 in | Wt 157.0 lb

## 2022-08-25 DIAGNOSIS — R413 Other amnesia: Secondary | ICD-10-CM | POA: Diagnosis not present

## 2022-08-25 MED ORDER — DONEPEZIL HCL 5 MG PO TABS: 5.0000 mg | ORAL_TABLET | Freq: Every day | ORAL | 11 refills | Status: AC

## 2022-08-25 NOTE — Progress Notes (Signed)
Memory Impairment  Cheryl Baker is a very pleasant 67 y.o. RH female with a history of hypertension, hyperlipidemia, prediabetes, gout, GERD, chronic neck pain, and situational depression seen today in follow up to discuss the 08/05/2022 MRI of the brain results after presenting on 11/29 /2023 with complaints of memory loss.  MoCA at the time was 22/30. These were personally reviewed, remarkable for mild generalized cerebral atrophy, mild chronic punctate microhemorrhage within the left periatrial white matter, no acute findings. Since her last visit patient has been on antidepressant per PCP, and is scheduled psychotherapy sessions beginning today. Her memory is stable however, it is felt prudent to start patient on antidementia medicine. Patient agrees.    Recommendations   Follow up in 6  months. Neurocognitive testing to further evaluate cognitive concerns and determine other underlying causes of memory changes including potential contribution from sleep anxiety or depression is scheduled for September 2024 Patient  has an appt for psychotherapy for depression, continue to control mood as per PCP. Start Donepezil 5mg   daily. Side effects discussed      Subjective:    This patient is accompanied in the office by her husband who supplements the history.  Previous records as well as any outside records available were reviewed prior to todays visit.    Any changes in memory since last visit? She reports her memory is "about the same", she may find herself repeating herself more often.    Initial evaluation 07/20/2022   How long did patient have memory difficulties?" After mom died in 09-Apr-2020 and dad passed 01-25-2023and also I was having major medical issues I noticed some changes". "Can't remember some things, trouble focusing and concentrating". Gotten worse over the last 6 months, having some difficulty in conversations and names. She is not very active with other intellectual  activities since their death, "I just don't want to do anything" repeats oneself? Endorsed Disoriented when walking into a room?  Patient denies   Leaving objects in unusual places?  Patient denies   Ambulates  with difficulty?   Patient denies   Recent falls?  Patient denies   Any head injuries?  Patient denies   History of seizures?   Patient denies   Wandering behavior?  Patient denies   Patient drives? " No issues", denies getting lost.   Any mood changes such irritability agitation?  History of depression for several years, Just started antidepressant for the last 2 weeks Any history of depression?:  Patient denies a prior diagnosis but she admits dealing with situational depression as above  Hallucinations?  Endorsed, " for the last year  I see a person but when I look no one is there, or sometimes I get kind of scared " I see my mom, she doesn't talk to me"  Paranoia?  Denies   Patient reports that  sleeps well without vivid dreams, REM behavior or sleepwalking . She goes to sleep at 6:30  and is up at 4:30 in am and then is tired though the day.  History of sleep apnea?  Patient denies   Any hygiene concerns?  Patient denies   Independent of bathing and dressing?  Endorsed  Does the patient needs help with medications? Patient in charge, occasionally he misses doses   Who is in charge of the finances?  Patient is in charge  Any changes in appetite?  Not as good as before     Patient have trouble swallowing? Patient denies  Does the patient cook?  Patient denies   Any kitchen accidents such as leaving the stove on? Patient denies   Any headaches?  When BP up  The double vision? Patient denies   Any focal numbness or tingling?  Patient denies   Chronic back pain Patient denies   Unilateral weakness?  Patient denies   Any tremors?  Patient denies   Any history of anosmia?  Patient denies   Any incontinence of urine?  Patient denies   Any bowel dysfunction?   Patient denies       History of heavy alcohol intake?  Patient denies   History of heavy tobacco use?  Patient denies   Family history of dementia? Mother, MGM, had dementia but was untreated and Father had Alzheimer's disease "an was treated" Patient lives with husband    Retired Forensic psychologist, 12th grade     MRI brain 08/05/2022 was remarkable for negative acute findings, mild generalized cerebral atrophy, mild chronic punctate microhemorrhage within the left periatrial white matter  CURRENT MEDICATIONS:  Outpatient Encounter Medications as of 08/25/2022  Medication Sig   amLODipine (NORVASC) 5 MG tablet Take 5 mg by mouth daily.   Ascorbic Acid (VITAMIN C PO) Take 1 tablet by mouth daily.   aspirin EC 81 MG tablet Take 81 mg by mouth daily.   atorvastatin (LIPITOR) 40 MG tablet Take 40 mg by mouth daily.   Calcium Carbonate-Vitamin D 600-200 MG-UNIT TABS Take by mouth daily.   Colchicine (MITIGARE) 0.6 MG CAPS Take 1 capsule by mouth 2 (two) times daily as needed.   finasteride (PROSCAR) 5 MG tablet Take by mouth.   ibuprofen (ADVIL) 200 MG tablet Take 200 mg by mouth every 8 (eight) hours as needed.   Multiple Vitamin (MULTI-VITAMIN) tablet Take by mouth.   Multiple Vitamins-Minerals (HAIR SKIN AND NAILS FORMULA PO) Take by mouth.   oxyCODONE-acetaminophen (PERCOCET/ROXICET) 5-325 MG tablet Take 1 tablet by mouth every 4 (four) hours as needed for severe pain.   potassium chloride SA (KLOR-CON) 20 MEQ tablet Take 20 mEq by mouth as needed.   No facility-administered encounter medications on file as of 08/25/2022.        No data to display            07/20/2022   11:00 AM  Montreal Cognitive Assessment   Visuospatial/ Executive (0/5) 1  Naming (0/3) 3  Attention: Read list of digits (0/2) 2  Attention: Read list of letters (0/1) 1  Attention: Serial 7 subtraction starting at 100 (0/3) 3  Language: Repeat phrase (0/2) 1  Language : Fluency (0/1) 1  Abstraction (0/2) 1  Delayed Recall (0/5) 4   Orientation (0/6) 4  Total 21  Adjusted Score (based on education) 22   Thank you for allowing [place] the opportunity to participate in the care of this nice patient. Please do not hesitate to contact us for any questions or concerns.   Total time spent on today's visit was 20 minutes dedicated to this patient today, preparing to see patient, examining the patient, ordering tests and/or medications and counseling the patient, documenting clinical information in the EHR or other health record, independently interpreting results and communicating results to the patient/family, discussing treatment and goals, answering patient's questions and coordinating care.  Cc:  Mayra Neer, MD  Sharene Butters 08/25/2022 7:42 AM

## 2022-08-25 NOTE — Patient Instructions (Addendum)
It was a pleasure to see you today at our office.   Recommendations:  Follow up in  6 month   Continue antidepressant Continue psychotherapy for depression  Recommend good control of cardiovascular risk factors.    Neurocognitive testing is scheduled for September 2024   Whom to call:  Memory  decline, memory medications: Call our office [phone]   For psychiatric meds, mood meds: Please have your primary care physician manage these medications.    For assessment of decision of mental capacity and competency:  Call Dr. Anthoney Harada, geriatric psychiatrist at [phone]    If you have any severe symptoms of a stroke, or other severe issues such as confusion,severe chills or fever, etc call 911 or go to the ER as you may need to be evaluated further          RECOMMENDATIONS FOR ALL PATIENTS WITH MEMORY PROBLEMS: 1. Continue to exercise (Recommend 30 minutes of walking everyday, or 3 hours every week) 2. Increase social interactions - continue going to [place] and enjoy social gatherings with friends and family 3. Eat healthy, avoid fried foods and eat more fruits and vegetables 4. Maintain adequate blood pressure, blood sugar, and blood cholesterol level. Reducing the risk of stroke and cardiovascular disease also helps promoting better memory. 5. Avoid stressful situations. Live a simple life and avoid aggravations. Organize your time and prepare for the next day in anticipation. 6. Sleep well, avoid any interruptions of sleep and avoid any distractions in the bedroom that may interfere with adequate sleep quality 7. Avoid sugar, avoid sweets as there is a strong link between excessive sugar intake, diabetes, and cognitive impairment We discussed the Mediterranean diet, which has been shown to help patients reduce the risk of progressive memory disorders and reduces cardiovascular risk. This includes eating fish, eat fruits and green leafy vegetables, nuts like almonds and  hazelnuts, walnuts, and also use olive oil. Avoid fast foods and fried foods as much as possible. Avoid sweets and sugar as sugar use has been linked to worsening of memory function.  There is always a concern of gradual progression of memory problems. If this is the case, then we may need to adjust level of care according to patient needs. Support, both to the patient and caregiver, should then be put into place.    FALL PRECAUTIONS: Be cautious when walking. Scan the area for obstacles that may increase the risk of trips and falls. When getting up in the mornings, sit up at the edge of the bed for a few minutes before getting out of bed. Consider elevating the bed at the head end to avoid drop of blood pressure when getting up. Walk always in a well-lit room (use night lights in the walls). Avoid area rugs or power cords from appliances in the middle of the walkways. Use a walker or a cane if necessary and consider physical therapy for balance exercise. Get your eyesight checked regularly.  FINANCIAL OVERSIGHT: Supervision, especially oversight when making financial decisions or transactions is also recommended.  HOME SAFETY: Consider the safety of the kitchen when operating appliances like stoves, microwave oven, and blender. Consider having supervision and share cooking responsibilities until no longer able to participate in those. Accidents with firearms and other hazards in the house should be identified and addressed as well.   ABILITY TO BE LEFT ALONE: If patient is unable to contact 911 operator, consider using LifeLine, or when the need is there, arrange for someone to stay  with patients. Smoking is a fire hazard, consider supervision or cessation. Risk of wandering should be assessed by caregiver and if detected at any point, supervision and safe proof recommendations should be instituted.  MEDICATION SUPERVISION: Inability to self-administer medication needs to be constantly addressed.  Implement a mechanism to ensure safe administration of the medications.   DRIVING: Regarding driving, in patients with progressive memory problems, driving will be impaired. We advise to have someone else do the driving if trouble finding directions or if minor accidents are reported. Independent driving assessment is available to determine safety of driving.   If you are interested in the driving assessment, you can contact the following:  [facility] [employer] in [place]  [address]  [place] [phone] or [phone]  Your provider has requested that you have labwork completed today. Please go to [facility] ([id number]) on the second floor of this building before leaving the office today. You do not need to check in. If you are not called within 15 minutes please check with the front desk.   We have sent a referral to [facility] for your MRI and they will call you directly to schedule your appointment. They are located at [address]. If you need to contact them directly please call [phone].

## 2022-09-03 DIAGNOSIS — F411 Generalized anxiety disorder: Secondary | ICD-10-CM | POA: Diagnosis not present

## 2022-09-03 DIAGNOSIS — R4189 Other symptoms and signs involving cognitive functions and awareness: Secondary | ICD-10-CM | POA: Diagnosis not present

## 2022-09-28 DIAGNOSIS — R040 Epistaxis: Secondary | ICD-10-CM | POA: Diagnosis not present

## 2022-09-30 ENCOUNTER — Telehealth: Payer: Self-pay

## 2022-09-30 NOTE — Telephone Encounter (Signed)
Patient is calling in stating that she seen her PCP and explained she was having nose bleeds. Karlin said that they referred her to another doctor, EEG and blood work was normal and was advised to contact us as she notices the nose bleeds after taking medication that Clarise Cruz prescribed. Wondering what to do.

## 2022-09-30 NOTE — Telephone Encounter (Signed)
Called patient and gave her [place] advice and told patient if she continues to have nose bleeds to go tot ED patient said it isn't a running bright red blood but has blood crust in her nose.

## 2022-10-06 DIAGNOSIS — Z23 Encounter for immunization: Secondary | ICD-10-CM | POA: Diagnosis not present

## 2022-10-06 DIAGNOSIS — I10 Essential (primary) hypertension: Secondary | ICD-10-CM | POA: Diagnosis not present

## 2022-10-06 DIAGNOSIS — E782 Mixed hyperlipidemia: Secondary | ICD-10-CM | POA: Diagnosis not present

## 2022-10-06 DIAGNOSIS — Z0000 Encounter for general adult medical examination without abnormal findings: Secondary | ICD-10-CM | POA: Diagnosis not present

## 2022-10-06 DIAGNOSIS — E1169 Type 2 diabetes mellitus with other specified complication: Secondary | ICD-10-CM | POA: Diagnosis not present

## 2022-11-22 DIAGNOSIS — H10412 Chronic giant papillary conjunctivitis, left eye: Secondary | ICD-10-CM | POA: Diagnosis not present

## 2022-12-08 DIAGNOSIS — R040 Epistaxis: Secondary | ICD-10-CM | POA: Diagnosis not present

## 2022-12-15 ENCOUNTER — Other Ambulatory Visit: Payer: Self-pay | Admitting: Family Medicine

## 2022-12-15 DIAGNOSIS — Z1231 Encounter for screening mammogram for malignant neoplasm of breast: Secondary | ICD-10-CM

## 2023-01-05 DIAGNOSIS — R040 Epistaxis: Secondary | ICD-10-CM | POA: Diagnosis not present

## 2023-02-01 ENCOUNTER — Ambulatory Visit: Payer: BC Managed Care – PPO

## 2023-02-01 DIAGNOSIS — H40013 Open angle with borderline findings, low risk, bilateral: Secondary | ICD-10-CM | POA: Diagnosis not present

## 2023-02-02 ENCOUNTER — Ambulatory Visit
Admission: RE | Admit: 2023-02-02 | Discharge: 2023-02-02 | Disposition: A | Discharge status: Home / Self Care | Payer: BC Managed Care – PPO | Source: Ambulatory Visit | Attending: Family Medicine | Admitting: Family Medicine

## 2023-02-02 DIAGNOSIS — Z1231 Encounter for screening mammogram for malignant neoplasm of breast: Secondary | ICD-10-CM | POA: Diagnosis not present

## 2023-02-23 ENCOUNTER — Ambulatory Visit: Payer: BC Managed Care – PPO | Admitting: Physician Assistant

## 2023-02-26 DIAGNOSIS — H6503 Acute serous otitis media, bilateral: Secondary | ICD-10-CM | POA: Diagnosis not present

## 2023-04-20 DIAGNOSIS — E1169 Type 2 diabetes mellitus with other specified complication: Secondary | ICD-10-CM | POA: Diagnosis not present

## 2023-04-20 DIAGNOSIS — F411 Generalized anxiety disorder: Secondary | ICD-10-CM | POA: Diagnosis not present

## 2023-04-20 DIAGNOSIS — I10 Essential (primary) hypertension: Secondary | ICD-10-CM | POA: Diagnosis not present

## 2023-04-20 DIAGNOSIS — E782 Mixed hyperlipidemia: Secondary | ICD-10-CM | POA: Diagnosis not present

## 2023-05-03 DIAGNOSIS — H40013 Open angle with borderline findings, low risk, bilateral: Secondary | ICD-10-CM | POA: Diagnosis not present

## 2023-05-04 ENCOUNTER — Encounter: Payer: BC Managed Care – PPO | Admitting: Psychology

## 2023-05-09 DIAGNOSIS — L668 Other cicatricial alopecia: Secondary | ICD-10-CM | POA: Diagnosis not present

## 2023-05-11 ENCOUNTER — Encounter: Payer: BC Managed Care – PPO | Admitting: Psychology

## 2023-05-18 DIAGNOSIS — M7662 Achilles tendinitis, left leg: Secondary | ICD-10-CM | POA: Diagnosis not present

## 2023-05-18 DIAGNOSIS — M25561 Pain in right knee: Secondary | ICD-10-CM | POA: Diagnosis not present

## 2023-06-01 DIAGNOSIS — M25561 Pain in right knee: Secondary | ICD-10-CM | POA: Diagnosis not present

## 2023-06-01 DIAGNOSIS — M7662 Achilles tendinitis, left leg: Secondary | ICD-10-CM | POA: Diagnosis not present

## 2023-06-06 DIAGNOSIS — J988 Other specified respiratory disorders: Secondary | ICD-10-CM | POA: Diagnosis not present

## 2023-06-15 ENCOUNTER — Other Ambulatory Visit: Payer: Self-pay | Admitting: Family Medicine

## 2023-06-15 ENCOUNTER — Ambulatory Visit
Admission: RE | Admit: 2023-06-15 | Discharge: 2023-06-15 | Disposition: A | Discharge status: Home / Self Care | Payer: BC Managed Care – PPO | Source: Ambulatory Visit | Attending: Family Medicine | Admitting: Family Medicine

## 2023-06-15 DIAGNOSIS — R059 Cough, unspecified: Secondary | ICD-10-CM | POA: Diagnosis not present

## 2023-06-15 DIAGNOSIS — R058 Other specified cough: Secondary | ICD-10-CM

## 2023-06-15 DIAGNOSIS — F322 Major depressive disorder, single episode, severe without psychotic features: Secondary | ICD-10-CM | POA: Diagnosis not present

## 2023-06-15 DIAGNOSIS — R0602 Shortness of breath: Secondary | ICD-10-CM | POA: Diagnosis not present

## 2023-06-29 DIAGNOSIS — Z23 Encounter for immunization: Secondary | ICD-10-CM | POA: Diagnosis not present

## 2023-08-04 DIAGNOSIS — H40013 Open angle with borderline findings, low risk, bilateral: Secondary | ICD-10-CM | POA: Diagnosis not present

## 2023-11-16 ENCOUNTER — Other Ambulatory Visit: Payer: Self-pay | Admitting: Family Medicine

## 2023-11-16 DIAGNOSIS — E1169 Type 2 diabetes mellitus with other specified complication: Secondary | ICD-10-CM | POA: Diagnosis not present

## 2023-11-16 DIAGNOSIS — E2839 Other primary ovarian failure: Secondary | ICD-10-CM

## 2023-11-16 DIAGNOSIS — E782 Mixed hyperlipidemia: Secondary | ICD-10-CM | POA: Diagnosis not present

## 2023-11-16 DIAGNOSIS — Z0000 Encounter for general adult medical examination without abnormal findings: Secondary | ICD-10-CM | POA: Diagnosis not present

## 2023-11-16 DIAGNOSIS — I10 Essential (primary) hypertension: Secondary | ICD-10-CM | POA: Diagnosis not present

## 2023-11-16 DIAGNOSIS — R4189 Other symptoms and signs involving cognitive functions and awareness: Secondary | ICD-10-CM | POA: Diagnosis not present

## 2023-11-25 ENCOUNTER — Telehealth: Payer: Self-pay

## 2023-11-25 NOTE — Telephone Encounter (Signed)
Called and left a VM for patient to return my call concerning gel injection.

## 2023-11-25 NOTE — Telephone Encounter (Signed)
 Patient would like a CB to discuss getting gel injection. Stated that she has some questions, before I submit to her insurance.  CB# [phone].  Please advise.  Thank you.

## 2023-11-28 NOTE — Telephone Encounter (Signed)
 The pt has not been seen in the office since 11/2021. Before I call her will she need a recent office visit note in order to apply for insurance approval?

## 2023-11-28 NOTE — Telephone Encounter (Signed)
 Yes she will.  Thank you.

## 2023-11-28 NOTE — Telephone Encounter (Signed)
 I called pt and she sch appt for 12/26/2023 her next day off from work.

## 2023-12-25 DIAGNOSIS — J411 Mucopurulent chronic bronchitis: Secondary | ICD-10-CM | POA: Diagnosis not present

## 2023-12-25 DIAGNOSIS — J019 Acute sinusitis, unspecified: Secondary | ICD-10-CM | POA: Diagnosis not present

## 2023-12-25 DIAGNOSIS — R059 Cough, unspecified: Secondary | ICD-10-CM | POA: Diagnosis not present

## 2023-12-26 ENCOUNTER — Encounter: Payer: Self-pay | Admitting: Orthopedic Surgery

## 2023-12-26 ENCOUNTER — Ambulatory Visit (INDEPENDENT_AMBULATORY_CARE_PROVIDER_SITE_OTHER): Admitting: Orthopedic Surgery

## 2023-12-26 DIAGNOSIS — M1711 Unilateral primary osteoarthritis, right knee: Secondary | ICD-10-CM

## 2023-12-26 MED ORDER — LIDOCAINE HCL (PF) 1 % IJ SOLN
5.0000 mL | INTRAMUSCULAR | Status: AC | PRN
  Administered 2023-12-26: 5 mL

## 2023-12-26 MED ORDER — METHYLPREDNISOLONE ACETATE 40 MG/ML IJ SUSP
40.0000 mg | INTRAMUSCULAR | Status: AC | PRN
  Administered 2023-12-26: 40 mg via INTRA_ARTICULAR

## 2023-12-26 NOTE — Progress Notes (Signed)
 Office Visit Note   Patient: Cheryl Baker           Date of Birth: 12-Sep-1955           MRN: [id number] Visit Date: 12/26/2023              Requested by: Glena Landau, MD [address] [place],  [place] [address] PCP: Glena Landau, MD  Chief Complaint  Patient presents with   Right Knee - Follow-up      HPI: Patient is a 68 year old woman with osteoarthritis right knee pain in the medial joint line as well as patellofemoral joint.  Patient states she has pain going up and down a flight of stairs.  Patient is status post right knee arthroscopy in 2022 and most recent radiographs in 2023 showed bone-on-bone contact medial joint line.  Assessment & Plan: Visit Diagnoses:  1. Unilateral primary osteoarthritis, right knee     Plan: Right knee was injected she tolerated this well recommended strengthening, exercise, if this only provides some temporary relief we could proceed with a hyaluronic acid injection.  Follow-Up Instructions: Return if symptoms worsen or fail to improve.   Ortho Exam  Patient is alert, oriented, no adenopathy, well-dressed, normal affect, normal respiratory effort. Examination patient has no effusion of the right knee she has full range of motion with mild crepitation range of motion.  There is tenderness to palpation of the patellofemoral joint as well as medial joint line.  Imaging: No results found. No images are attached to the encounter.  Labs: No results found for: "HGBA1C", "ESRSEDRATE", "CRP", "LABURIC", "REPTSTATUS", "GRAMSTAIN", "CULT", "LABORGA"   Lab Results  Component Value Date   ALBUMIN 4.0 04/29/2008    No results found for: "MG" No results found for: "VD25OH"  No results found for: "PREALBUMIN"    04/29/2008    7:52 AM  CBC EXTENDED  WBC 5.4   RBC 5.10   Hemoglobin 15.3   HCT 44.6   Platelets 220   NEUT# 2.8   Lymph# 1.9      There is no height or weight on file to calculate BMI.  Orders:   No orders of the defined types were placed in this encounter.  No orders of the defined types were placed in this encounter.    Procedures: Large Joint Inj: R knee on 12/26/2023 11:45 AM Indications: pain and diagnostic evaluation Details: 22 G 1.5 in needle, anteromedial approach  Arthrogram: No  Medications: 5 mL lidocaine  (PF) 1 %; 40 mg methylPREDNISolone  acetate 40 MG/ML Outcome: tolerated well, no immediate complications Procedure, treatment alternatives, risks and benefits explained, specific risks discussed. Consent was given by the patient. Immediately prior to procedure a time out was called to verify the correct patient, procedure, equipment, support staff and site/side marked as required. Patient was prepped and draped in the usual sterile fashion.      Clinical Data: No additional findings.  ROS:  All other systems negative, except as noted in the HPI. Review of Systems  Objective: Vital Signs: There were no vitals taken for this visit.  Specialty Comments:  No specialty comments available.  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 No pertinent family history.  Past Surgical History:  Procedure Laterality Date   ABDOMINAL HYSTERECTOMY     ACHILLES TENDON REPAIR Left    ANKLE SURGERY     KNEE ARTHROSCOPY WITH MEDIAL MENISECTOMY Right 06/30/2021   Procedure: RIGHT KNEE ARTHROSCOPY AND DEBRIDEMENT of medial meniscus tear;  Surgeon: Timothy Ford, MD;  Location: [facility];  Service: Orthopedics;  Laterality: Right;   Social History   Occupational History   Not on file  Tobacco Use   Smoking status: Never    Smokeless tobacco: Never  Substance and Sexual Activity   Alcohol use: No   Drug use: No   Sexual activity: Not on file

## 2024-02-08 ENCOUNTER — Ambulatory Visit: Payer: Self-pay

## 2024-02-08 ENCOUNTER — Institutional Professional Consult (permissible substitution): Admitting: Psychology

## 2024-02-15 ENCOUNTER — Encounter: Admitting: Psychology

## 2024-02-16 DIAGNOSIS — H40013 Open angle with borderline findings, low risk, bilateral: Secondary | ICD-10-CM | POA: Diagnosis not present

## 2024-03-19 DIAGNOSIS — L6681 Central centrifugal cicatricial alopecia: Secondary | ICD-10-CM | POA: Diagnosis not present

## 2024-04-11 ENCOUNTER — Institutional Professional Consult (permissible substitution): Admitting: Psychology

## 2024-04-11 ENCOUNTER — Ambulatory Visit: Payer: Self-pay

## 2024-04-13 ENCOUNTER — Ambulatory Visit
Admission: RE | Admit: 2024-04-13 | Discharge: 2024-04-13 | Disposition: A | Discharge status: Home / Self Care | Source: Ambulatory Visit | Attending: Family Medicine | Admitting: Family Medicine

## 2024-04-13 ENCOUNTER — Other Ambulatory Visit: Payer: Self-pay | Admitting: Family Medicine

## 2024-04-13 DIAGNOSIS — Z1231 Encounter for screening mammogram for malignant neoplasm of breast: Secondary | ICD-10-CM

## 2024-04-20 ENCOUNTER — Encounter: Admitting: Psychology

## 2024-05-16 DIAGNOSIS — Z23 Encounter for immunization: Secondary | ICD-10-CM | POA: Diagnosis not present

## 2024-05-16 DIAGNOSIS — I10 Essential (primary) hypertension: Secondary | ICD-10-CM | POA: Diagnosis not present

## 2024-05-16 DIAGNOSIS — E1169 Type 2 diabetes mellitus with other specified complication: Secondary | ICD-10-CM | POA: Diagnosis not present

## 2024-05-16 DIAGNOSIS — E782 Mixed hyperlipidemia: Secondary | ICD-10-CM | POA: Diagnosis not present

## 2024-06-13 ENCOUNTER — Institutional Professional Consult (permissible substitution): Admitting: Psychology

## 2024-06-13 ENCOUNTER — Ambulatory Visit: Payer: Self-pay

## 2024-06-20 ENCOUNTER — Encounter: Admitting: Psychology

## 2024-07-25 ENCOUNTER — Other Ambulatory Visit
